# Patient Record
Sex: Male | Born: 1940 | Race: White | Hispanic: No | Marital: Married | Smoking: Never smoker
Health system: Southern US, Community
[De-identification: ages and names within clinical notes are randomized; demographics above are authoritative.]

## PROBLEM LIST (undated history)

## (undated) DIAGNOSIS — K219 Gastro-esophageal reflux disease without esophagitis: Secondary | ICD-10-CM

## (undated) DIAGNOSIS — E119 Type 2 diabetes mellitus without complications: Secondary | ICD-10-CM

## (undated) DIAGNOSIS — I639 Cerebral infarction, unspecified: Secondary | ICD-10-CM

## (undated) DIAGNOSIS — E785 Hyperlipidemia, unspecified: Secondary | ICD-10-CM

## (undated) DIAGNOSIS — Z87442 Personal history of urinary calculi: Secondary | ICD-10-CM

## (undated) DIAGNOSIS — M1990 Unspecified osteoarthritis, unspecified site: Secondary | ICD-10-CM

## (undated) DIAGNOSIS — Z9889 Other specified postprocedural states: Secondary | ICD-10-CM

## (undated) DIAGNOSIS — I10 Essential (primary) hypertension: Secondary | ICD-10-CM

## (undated) DIAGNOSIS — C801 Malignant (primary) neoplasm, unspecified: Secondary | ICD-10-CM

## (undated) HISTORY — PX: FRACTURE SURGERY: SHX138

## (undated) HISTORY — PX: TONSILLECTOMY: SUR1361

## (undated) HISTORY — PX: HERNIA REPAIR: SHX51

---

## 2007-06-25 ENCOUNTER — Ambulatory Visit: Payer: Self-pay

## 2007-12-02 ENCOUNTER — Ambulatory Visit: Payer: Self-pay | Admitting: Internal Medicine

## 2008-07-01 ENCOUNTER — Ambulatory Visit: Payer: Self-pay | Admitting: Unknown Physician Specialty

## 2009-08-16 ENCOUNTER — Ambulatory Visit: Payer: Self-pay | Admitting: Internal Medicine

## 2010-02-21 ENCOUNTER — Ambulatory Visit: Payer: Self-pay | Admitting: Internal Medicine

## 2013-10-22 ENCOUNTER — Ambulatory Visit: Payer: Self-pay | Admitting: Unknown Physician Specialty

## 2017-03-05 DIAGNOSIS — G4730 Sleep apnea, unspecified: Secondary | ICD-10-CM | POA: Diagnosis present

## 2017-03-05 DIAGNOSIS — Z8546 Personal history of malignant neoplasm of prostate: Secondary | ICD-10-CM

## 2018-03-10 DIAGNOSIS — E1142 Type 2 diabetes mellitus with diabetic polyneuropathy: Secondary | ICD-10-CM | POA: Diagnosis present

## 2019-03-18 DIAGNOSIS — I10 Essential (primary) hypertension: Secondary | ICD-10-CM | POA: Diagnosis present

## 2019-08-12 ENCOUNTER — Inpatient Hospital Stay
Admission: EM | Admit: 2019-08-12 | Discharge: 2019-08-13 | DRG: 066 | Disposition: A | Discharge status: Home / Self Care | Payer: Medicare Other | Attending: Hospitalist | Admitting: Hospitalist

## 2019-08-12 ENCOUNTER — Other Ambulatory Visit: Payer: Self-pay

## 2019-08-12 ENCOUNTER — Emergency Department: Payer: Medicare Other

## 2019-08-12 DIAGNOSIS — G4733 Obstructive sleep apnea (adult) (pediatric): Secondary | ICD-10-CM | POA: Diagnosis present

## 2019-08-12 DIAGNOSIS — E1140 Type 2 diabetes mellitus with diabetic neuropathy, unspecified: Secondary | ICD-10-CM | POA: Diagnosis present

## 2019-08-12 DIAGNOSIS — E1142 Type 2 diabetes mellitus with diabetic polyneuropathy: Secondary | ICD-10-CM | POA: Diagnosis present

## 2019-08-12 DIAGNOSIS — E1151 Type 2 diabetes mellitus with diabetic peripheral angiopathy without gangrene: Secondary | ICD-10-CM | POA: Diagnosis present

## 2019-08-12 DIAGNOSIS — Z79899 Other long term (current) drug therapy: Secondary | ICD-10-CM

## 2019-08-12 DIAGNOSIS — Z833 Family history of diabetes mellitus: Secondary | ICD-10-CM

## 2019-08-12 DIAGNOSIS — E669 Obesity, unspecified: Secondary | ICD-10-CM | POA: Diagnosis present

## 2019-08-12 DIAGNOSIS — R29700 NIHSS score 0: Secondary | ICD-10-CM | POA: Diagnosis present

## 2019-08-12 DIAGNOSIS — Z7984 Long term (current) use of oral hypoglycemic drugs: Secondary | ICD-10-CM

## 2019-08-12 DIAGNOSIS — E785 Hyperlipidemia, unspecified: Secondary | ICD-10-CM | POA: Diagnosis present

## 2019-08-12 DIAGNOSIS — R471 Dysarthria and anarthria: Secondary | ICD-10-CM | POA: Diagnosis present

## 2019-08-12 DIAGNOSIS — Z9114 Patient's other noncompliance with medication regimen: Secondary | ICD-10-CM

## 2019-08-12 DIAGNOSIS — R4701 Aphasia: Secondary | ICD-10-CM | POA: Diagnosis present

## 2019-08-12 DIAGNOSIS — D329 Benign neoplasm of meninges, unspecified: Secondary | ICD-10-CM

## 2019-08-12 DIAGNOSIS — I639 Cerebral infarction, unspecified: Secondary | ICD-10-CM | POA: Diagnosis present

## 2019-08-12 DIAGNOSIS — G4730 Sleep apnea, unspecified: Secondary | ICD-10-CM | POA: Diagnosis present

## 2019-08-12 DIAGNOSIS — Z8546 Personal history of malignant neoplasm of prostate: Secondary | ICD-10-CM

## 2019-08-12 DIAGNOSIS — I6381 Other cerebral infarction due to occlusion or stenosis of small artery: Principal | ICD-10-CM

## 2019-08-12 DIAGNOSIS — Z20822 Contact with and (suspected) exposure to covid-19: Secondary | ICD-10-CM | POA: Diagnosis present

## 2019-08-12 DIAGNOSIS — I10 Essential (primary) hypertension: Secondary | ICD-10-CM | POA: Diagnosis present

## 2019-08-12 DIAGNOSIS — Z8042 Family history of malignant neoplasm of prostate: Secondary | ICD-10-CM

## 2019-08-12 DIAGNOSIS — Z6839 Body mass index (BMI) 39.0-39.9, adult: Secondary | ICD-10-CM

## 2019-08-12 DIAGNOSIS — Z7982 Long term (current) use of aspirin: Secondary | ICD-10-CM

## 2019-08-12 HISTORY — DX: Essential (primary) hypertension: I10

## 2019-08-12 HISTORY — DX: Hyperlipidemia, unspecified: E78.5

## 2019-08-12 HISTORY — DX: Type 2 diabetes mellitus without complications: E11.9

## 2019-08-12 LAB — GLUCOSE, CAPILLARY
Glucose-Capillary: 151 mg/dL — ABNORMAL HIGH (ref 70–99)
Glucose-Capillary: 156 mg/dL — ABNORMAL HIGH (ref 70–99)
Glucose-Capillary: 159 mg/dL — ABNORMAL HIGH (ref 70–99)

## 2019-08-12 LAB — CBC
HCT: 47 % (ref 39.0–52.0)
Hemoglobin: 15.7 g/dL (ref 13.0–17.0)
MCH: 30.6 pg (ref 26.0–34.0)
MCHC: 33.4 g/dL (ref 30.0–36.0)
MCV: 91.6 fL (ref 80.0–100.0)
Platelets: 294 10*3/uL (ref 150–400)
RBC: 5.13 MIL/uL (ref 4.22–5.81)
RDW: 13.1 % (ref 11.5–15.5)
WBC: 11.7 10*3/uL — ABNORMAL HIGH (ref 4.0–10.5)
nRBC: 0 % (ref 0.0–0.2)

## 2019-08-12 LAB — COMPREHENSIVE METABOLIC PANEL
ALT: 17 U/L (ref 0–44)
AST: 15 U/L (ref 15–41)
Albumin: 4.2 g/dL (ref 3.5–5.0)
Alkaline Phosphatase: 47 U/L (ref 38–126)
Anion gap: 9 (ref 5–15)
BUN: 14 mg/dL (ref 8–23)
CO2: 24 mmol/L (ref 22–32)
Calcium: 9.3 mg/dL (ref 8.9–10.3)
Chloride: 105 mmol/L (ref 98–111)
Creatinine, Ser: 0.92 mg/dL (ref 0.61–1.24)
GFR calc Af Amer: >60 mL/min (ref >60)
GFR calc non Af Amer: >60 mL/min (ref >60)
Glucose, Bld: 164 mg/dL — ABNORMAL HIGH (ref 70–99)
Potassium: 3.8 mmol/L (ref 3.5–5.1)
Sodium: 138 mmol/L (ref 135–145)
Total Bilirubin: 0.8 mg/dL (ref 0.3–1.2)
Total Protein: 7.5 g/dL (ref 6.5–8.1)

## 2019-08-12 LAB — DIFFERENTIAL
Abs Immature Granulocytes: 0.04 10*3/uL (ref 0.00–0.07)
Basophils Absolute: 0.1 10*3/uL (ref 0.0–0.1)
Basophils Relative: 1 %
Eosinophils Absolute: 0.1 10*3/uL (ref 0.0–0.5)
Eosinophils Relative: 1 %
Immature Granulocytes: 0 %
Lymphocytes Relative: 27 %
Lymphs Abs: 3.1 10*3/uL (ref 0.7–4.0)
Monocytes Absolute: 0.6 10*3/uL (ref 0.1–1.0)
Monocytes Relative: 6 %
Neutro Abs: 7.7 10*3/uL (ref 1.7–7.7)
Neutrophils Relative %: 65 %

## 2019-08-12 LAB — CREATININE, SERUM
Creatinine, Ser: 0.95 mg/dL (ref 0.61–1.24)
GFR calc Af Amer: >60 mL/min (ref >60)
GFR calc non Af Amer: >60 mL/min (ref >60)

## 2019-08-12 LAB — PROTIME-INR
INR: 1 (ref 0.8–1.2)
Prothrombin Time: 12.9 seconds (ref 11.4–15.2)

## 2019-08-12 LAB — APTT: aPTT: 29 seconds (ref 24–36)

## 2019-08-12 MED ORDER — ASPIRIN EC 325 MG PO TBEC
325.0000 mg | DELAYED_RELEASE_TABLET | Freq: Every day | ORAL | Status: DC
  Administered 2019-08-12 – 2019-08-13 (×2): 325 mg via ORAL
  Filled 2019-08-12 (×2): qty 1

## 2019-08-12 MED ORDER — ENOXAPARIN SODIUM 40 MG/0.4ML ~~LOC~~ SOLN
40.0000 mg | SUBCUTANEOUS | Status: DC
  Administered 2019-08-13: 40 mg via SUBCUTANEOUS
  Filled 2019-08-12: qty 0.4

## 2019-08-12 MED ORDER — INSULIN ASPART 100 UNIT/ML ~~LOC~~ SOLN
0.0000 [IU] | SUBCUTANEOUS | Status: DC
  Administered 2019-08-12: 4 [IU] via SUBCUTANEOUS
  Administered 2019-08-13 (×2): 3 [IU] via SUBCUTANEOUS
  Filled 2019-08-12 (×3): qty 1

## 2019-08-12 MED ORDER — ACETAMINOPHEN 650 MG RE SUPP: 650.0000 mg | RECTAL | Status: DC | PRN

## 2019-08-12 MED ORDER — ASPIRIN 81 MG PO CHEW
324.0000 mg | CHEWABLE_TABLET | Freq: Once | ORAL | Status: AC
  Administered 2019-08-12: 324 mg via ORAL
  Filled 2019-08-12: qty 4

## 2019-08-12 MED ORDER — ASPIRIN 300 MG RE SUPP
300.0000 mg | Freq: Every day | RECTAL | Status: DC
  Filled 2019-08-12: qty 1

## 2019-08-12 MED ORDER — SODIUM CHLORIDE 0.9 % IV SOLN: INTRAVENOUS | Status: DC

## 2019-08-12 MED ORDER — ACETAMINOPHEN 325 MG PO TABS: 650.0000 mg | ORAL_TABLET | ORAL | Status: DC | PRN

## 2019-08-12 MED ORDER — STROKE: EARLY STAGES OF RECOVERY BOOK: Freq: Once | Status: DC

## 2019-08-12 MED ORDER — SODIUM CHLORIDE 0.9% FLUSH: 3.0000 mL | Freq: Once | INTRAVENOUS | Status: DC

## 2019-08-12 MED ORDER — ACETAMINOPHEN 160 MG/5ML PO SOLN
650.0000 mg | ORAL | Status: DC | PRN
  Filled 2019-08-12: qty 20.3

## 2019-08-12 NOTE — ED Triage Notes (Signed)
First Nurse Note: Patient reports he was sent by PCP for slurred speech that started yesterday

## 2019-08-12 NOTE — H&P (Signed)
History and Physical    Markevius Dieleman [id number] DOB: Dec 20, 1940 DOA: 08/12/2019  PCP: Patient, No Pcp Per   Patient coming from: home  I have personally briefly reviewed patient's old medical records in [place]  Chief Complaint: slurred speech x 36 hours  HPI: CATALINO GURRY Sr. is a 79 y.o. male with medical history significant for obesity, hypertension, type 2 diabetes with neuropathy and sleep apnea who presented to the emergency room with a complaint of slurred speech that started around 1 PM the day prior.  He denied headache, visual changes and denied numbness weakness of the face or extremities.  ED Course: On arrival to the emergency room blood pressure was 161/109 with otherwise normal vitals.  His blood work was unremarkable.  Showed a 10 mm acute lacunar infarct within the left thalamus.  There was also a finding of a 1.8 x 1.4 mass within the right middle cranial fossa with mild mass-effect likely representing a meningioma.  Patient was given aspirin.  Hospitalist consulted for admission  Review of Systems: As per HPI otherwise 10 point review of systems negative.    Past Medical History:  Diagnosis Date  . Diabetes mellitus without complication ([place])   . Hyperlipidemia   . Hypertension     Past Surgical History:  Procedure Laterality Date  . FRACTURE SURGERY    . HERNIA REPAIR    . TONSILLECTOMY       reports that he has never smoked. He has never used smokeless tobacco. He reports previous alcohol use. He reports previous drug use.  Allergies  Allergen Reactions  . Sulfa Antibiotics Hives and Rash    Pruritis, scalp tingles, whelps Other reaction(s): Other (See Comments) Pruritis, scalp tingles, whelps Other reaction(s): Other (See Comments) Pruritis, scalp tingles, whelps     No family history on file.   Prior to Admission medications   Not on File    Physical Exam: Vitals:   08/12/19 1245 08/12/19 1700 08/12/19 1730  08/12/19 1912  BP:  (!) 153/96 (!) 157/100 (!) 152/96  Pulse:  89 (!) 101 90  Resp:  15 (!) 23 20  Temp:      TempSrc:      SpO2:  95% 99% 97%  Weight: 131.1 kg     Height: 6' (1.829 m)        Vitals:   08/12/19 1245 08/12/19 1700 08/12/19 1730 08/12/19 1912  BP:  (!) 153/96 (!) 157/100 (!) 152/96  Pulse:  89 (!) 101 90  Resp:  15 (!) 23 20  Temp:      TempSrc:      SpO2:  95% 99% 97%  Weight: 131.1 kg     Height: 6' (1.829 m)       Constitutional: NAD, alert and oriented x 3 Eyes: PERRL, lids and conjunctivae normal ENMT: Mucous membranes are moist. Hearing impairment Neck: normal, supple, no masses, no thyromegaly Respiratory: clear to auscultation bilaterally, no wheezing, no crackles. Normal respiratory effort. No accessory muscle use.  Cardiovascular: Regular rate and rhythm, no murmurs / rubs / gallops. No extremity edema. 2+ pedal pulses. No carotid bruits.  Abdomen: no tenderness, no masses palpated. No hepatosplenomegaly. Bowel sounds positive.  Musculoskeletal: no clubbing / cyanosis. No joint deformity upper and lower extremities.  Skin: no rashes, lesions, ulcers.  Neurologic: No gross focal neurologic deficit.speech with mild dysarthria Psychiatric: Normal mood and affect.   Labs on Admission: I have personally reviewed following labs and imaging studies  CBC: Recent Labs  Lab 08/12/19 1247  WBC 11.7*  NEUTROABS 7.7  HGB 15.7  HCT 47.0  MCV 91.6  PLT [id number]   Basic Metabolic Panel: Recent Labs  Lab 08/12/19 1247  NA 138  K 3.8  CL 105  CO2 24  GLUCOSE 164*  BUN 14  CREATININE 0.92  CALCIUM 9.3   GFR: Estimated Creatinine Clearance: 95.7 mL/min (by C-G formula based on SCr of 0.92 mg/dL). Liver Function Tests: Recent Labs  Lab 08/12/19 1247  AST 15  ALT 17  ALKPHOS 47  BILITOT 0.8  PROT 7.5  ALBUMIN 4.2   No results for input(s): LIPASE, AMYLASE in the last 168 hours. No results for input(s): AMMONIA in the last 168  hours. Coagulation Profile: Recent Labs  Lab 08/12/19 1247  INR 1.0   Cardiac Enzymes: No results for input(s): CKTOTAL, CKMB, CKMBINDEX, TROPONINI in the last 168 hours. BNP (last 3 results) No results for input(s): PROBNP in the last 8760 hours. HbA1C: No results for input(s): HGBA1C in the last 72 hours. CBG: Recent Labs  Lab 08/12/19 1253  GLUCAP 151*   Lipid Profile: No results for input(s): CHOL, HDL, LDLCALC, TRIG, CHOLHDL, LDLDIRECT in the last 72 hours. Thyroid Function Tests: No results for input(s): TSH, T4TOTAL, FREET4, T3FREE, THYROIDAB in the last 72 hours. Anemia Panel: No results for input(s): VITAMINB12, FOLATE, FERRITIN, TIBC, IRON, RETICCTPCT in the last 72 hours. Urine analysis: No results found for: COLORURINE, APPEARANCEUR, LABSPEC, [place], GLUCOSEU, [place], BILIRUBINUR, [place], PROTEINUR, UROBILINOGEN, NITRITE, LEUKOCYTESUR  Radiological Exams on Admission: CT HEAD WO CONTRAST  Result Date: 08/12/2019 CLINICAL DATA:  Stroke-like symptoms with slurred speech for 1 day EXAM: CT HEAD WITHOUT CONTRAST TECHNIQUE: Contiguous axial images were obtained from the base of the skull through the vertex without intravenous contrast. COMPARISON:  08/16/2009 FINDINGS: Brain: Mild atrophic changes are noted commensurate with the patient's given age. No findings to suggest acute hemorrhage, acute infarction or space-occupying mass lesion are noted. Old lacunar infarct is noted within the thalamus on the left new from the prior exam. Vascular: No hyperdense vessel or unexpected calcification. Skull: Normal. Negative for fracture or focal lesion. Sinuses/Orbits: No acute finding. Other: None. IMPRESSION: Chronic atrophic and ischemic changes.  No acute abnormality noted. Electronically Signed   By: Inez Catalina M.D.   On: 08/12/2019 13:09   MR BRAIN WO CONTRAST  Result Date: 08/12/2019 CLINICAL DATA:  Focal neuro deficit, greater than 6 hours, stroke suspected. Additional  history provided by scanning technologist: Patient reports feeling "whoozy" and having slurred speech after lunch yesterday. Patient reports today having intermittent slurred speech. EXAM: MRI HEAD WITHOUT CONTRAST TECHNIQUE: Multiplanar, multiecho pulse sequences of the brain and surrounding structures were obtained without intravenous contrast. 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 Corresponding T2/FLAIR hyperintensity at this site. Mild scattered T2/FLAIR hyperintensity within the cerebral white matter is nonspecific, but consistent with chronic small vessel ischemic disease. Mild generalized parenchymal atrophy. Tiny chronic lacunar infarct within the inferior right cerebellum. There is an extra-axial mass along the anterior right middle cranial fossa measuring 1.8 x 1.4 cm in transaxial dimensions (series 12, image 10). This demonstrates T1 and T2 intermediate signal and slight T2 FLAIR hyperintensity. Mild mass effect upon the underlying anterior right temporal lobe. This finding was not present on prior MRI 02/21/2010. No midline shift or extra-axial fluid collection. No chronic intracranial blood  products. Vascular: Flow voids maintained within the proximal large arterial vessels. Skull and upper cervical spine: No focal marrow lesion. Sinuses/Orbits: Visualized orbits demonstrate no acute abnormality. Mild mucosal thickening within the inferior maxillary sinuses. No significant mastoid effusion. 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 1.8 x 1.4 cm extra-axial mass within the anterior right middle cranial fossa as described. This finding was not present on prior MRI 02/21/2010. There is mild mass effect upon the underlying right temporal lobe without parenchymal signal abnormality. This likely reflects a meningioma, but is incompletely assessed on this  noncontrast study. Nonemergent contrast-enhanced brain MRI is recommended for further evaluation. Mild generalized parenchymal atrophy and chronic small vessel ischemic disease. Mild maxillary paranasal sinus mucosal thickening. Electronically Signed   By: Kellie Simmering DO   On: 08/12/2019 19:02    EKG: Independently reviewed.   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 until speech therapy evaluation Physical therapy, occupational therapy and dietary consultations Neurology consult requested  Essential hypertension Allow permissive hypertension for the first 24 hours and resume home antihypertensives when appropriate    Meningioma [facility] Consider elective neurosurgical consult  Type 2 diabetes with neuropathy Supplemental insulin coverage Follow A1c  Obstructive sleep apnea CPAP as desired by patient  History of prostate cancer No acute issues  Obesity with diabetes and obstructive sleep apnea Obesity contributing to chronic medical conditions and overall complicating overall prognosis and care       DVT prophylaxis: lovenox  Code Status: full  Family Communication: none  Disposition Plan: Back to previous home environment Consults called: Neurologist, Dr. Viviano Simas MD [facility]     08/12/2019, 8:10 PM

## 2019-08-12 NOTE — ED Notes (Signed)
Pt given diet soda per his request. Tolerated well.

## 2019-08-12 NOTE — ED Notes (Addendum)
Patient ambulatory to restroom. Gait steady.

## 2019-08-12 NOTE — ED Provider Notes (Signed)
[facility] Emergency Department Provider Note   ____________________________________________   First MD Initiated Contact with Patient 08/12/19 1636     (approximate)  I have reviewed the triage vital signs and the nursing notes.   HISTORY  Chief Complaint Aphasia    HPI Paul Haas. is a 79 y.o. male with past medical history of hypertension hyperlipidemia, and diabetes who presents to the ED for slurred speech.  Patient reports that he has noticed he has been slurring his speech since around 1 PM yesterday.  He complains of feeling "woozy" and lightheaded at times, but denies any vision changes, numbness, or weakness.  Symptoms have been constant since onset and he denies any similar issues in the past.  Family was concerned about a stroke and insisted he be evaluated in the ED.  Patient denies any alcohol or drug abuse.        Past Medical History:  Diagnosis Date  . Diabetes mellitus without complication ([place])   . Hyperlipidemia   . Hypertension     There are no problems to display for this patient.   Past Surgical History:  Procedure Laterality Date  . FRACTURE SURGERY    . HERNIA REPAIR    . TONSILLECTOMY      Prior to Admission medications   Not on File    Allergies Sulfa antibiotics  No family history on file.  Social History Social History   Tobacco Use  . Smoking status: Never Smoker  . Smokeless tobacco: Never Used  Substance Use Topics  . Alcohol use: Not Currently  . Drug use: Not Currently    Review of Systems  Constitutional: No fever/chills Eyes: No visual changes. ENT: No sore throat. Cardiovascular: Denies chest pain. Respiratory: Denies shortness of breath. Gastrointestinal: No abdominal pain.  No nausea, no vomiting.  No diarrhea.  No constipation. Genitourinary: Negative for dysuria. Musculoskeletal: Negative for back pain. Skin: Negative for rash. Neurological: Negative for headaches,  focal weakness or numbness.  Positive for slurred speech.  ____________________________________________   PHYSICAL EXAM:  VITAL SIGNS: ED Triage Vitals  Enc Vitals Group     BP 08/12/19 1243 (!) 161/109     Pulse Rate 08/12/19 1243 86     Resp 08/12/19 1243 17     Temp 08/12/19 1243 97.9 F (36.6 C)     Temp Source 08/12/19 1243 Oral     SpO2 08/12/19 1243 95 %     Weight 08/12/19 1245 289 lb (131.1 kg)     Height 08/12/19 1245 6' (1.829 m)     Head Circumference --      Peak Flow --      Pain Score 08/12/19 1245 0     Pain Loc --      Pain Edu? --      Excl. in [place]? --     Constitutional: Alert and oriented. Eyes: Conjunctivae are normal. Head: Atraumatic. Nose: No congestion/rhinnorhea. Mouth/Throat: Mucous membranes are moist. Neck: Normal ROM Cardiovascular: Normal rate, regular rhythm. Grossly normal heart sounds. Respiratory: Normal respiratory effort.  No retractions. Lungs CTAB. Gastrointestinal: Soft and nontender. No distention. Genitourinary: deferred Musculoskeletal: No lower extremity tenderness nor edema. Neurologic:  Normal speech and language. No gross focal neurologic deficits are appreciated.  Dysarthria noted.  5 out of 5 strength in bilateral upper and lower extremities with no pronator drift. Skin:  Skin is warm, dry and intact. No rash noted. Psychiatric: Mood and affect are normal. Speech and behavior are  normal.  ____________________________________________   LABS (all labs ordered are listed, but only abnormal results are displayed)  Labs Reviewed  CBC - Abnormal; Notable for the following components:      Result Value   WBC 11.7 (*)    All other components within normal limits  COMPREHENSIVE METABOLIC PANEL - Abnormal; Notable for the following components:   Glucose, Bld 164 (*)    All other components within normal limits  GLUCOSE, CAPILLARY - Abnormal; Notable for the following components:   Glucose-Capillary 151 (*)    All other  components within normal limits  SARS CORONAVIRUS 2 (TAT 6-24 HRS)  PROTIME-INR  APTT  DIFFERENTIAL  CBG MONITORING, ED  I-STAT CREATININE, ED   ____________________________________________  EKG  ED ECG REPORT I, Blake Divine, the attending physician, personally viewed and interpreted this ECG.   Date: 08/12/2019  EKG Time: 12:55  Rate: 83  Rhythm: normal sinus rhythm  Axis: LAD  Intervals:right bundle branch block  ST&T Change: None   PROCEDURES  Procedure(s) performed (including Critical Care):  Procedures   ____________________________________________   INITIAL IMPRESSION / ASSESSMENT AND PLAN / ED COURSE       79 year old male with history of hypertension, hyperlipidemia, and diabetes presents to the ED with slurred speech since 1 PM yesterday afternoon.  He does appear to have dysarthria on exam but no apparent aphasia and he is otherwise neurologically intact.  CT head was obtained in triage and is negative for acute process.  Patient is outside of the window for either TPA or endovascular intervention, will give a dose of aspirin.  Lab work is thus far unremarkable.  Given focal neurologic finding, I advised patient to be admitted for further stroke work-up, however he adamantly declines this.  He initially was unwilling to stay for MRI, but was eventually convinced to do so.  MRI significant for acute thalamic stroke, neurologic exam remains unchanged.  He is also noted to have what appears to be a meningioma, but this seems unlikely to be contributing to his current presentation.  Patient now agreeable to admission for further stroke work-up.      ____________________________________________   FINAL CLINICAL IMPRESSION(S) / ED DIAGNOSES  Final diagnoses:  Dysarthria  Thalamic stroke ([place])  Meningioma [facility])     ED Discharge Orders    None       Note:  This document was prepared using Dragon voice recognition software and may include  unintentional dictation errors.   Blake Divine, MD 08/12/19 [phone]

## 2019-08-12 NOTE — ED Notes (Signed)
Pt inquiring about wait; triage process and wait times explained to patient at this time.  Pt states, "Ok, I will wait about 30 more minutes."  Ambulatory in lobby, NAD noted at this time.

## 2019-08-12 NOTE — ED Triage Notes (Signed)
Pt c/o feeling "whoozy" and having slurred speech after lunch yesterday , states he today having intermittent slurred speech, slurred speech noted on arrival. Denies feeling dizzy or and numbness or tingling, denies any weakness, face is symmetrical. Pt ambulatory to triage with a steady gait, pt is a/ox4 at present.

## 2019-08-13 ENCOUNTER — Inpatient Hospital Stay: Payer: Medicare Other

## 2019-08-13 ENCOUNTER — Inpatient Hospital Stay
Admit: 2019-08-13 | Discharge: 2019-08-13 | Disposition: A | Discharge status: Home / Self Care | Payer: Medicare Other | Attending: Internal Medicine | Admitting: Internal Medicine

## 2019-08-13 DIAGNOSIS — E1140 Type 2 diabetes mellitus with diabetic neuropathy, unspecified: Secondary | ICD-10-CM | POA: Diagnosis present

## 2019-08-13 DIAGNOSIS — Z833 Family history of diabetes mellitus: Secondary | ICD-10-CM | POA: Diagnosis not present

## 2019-08-13 DIAGNOSIS — I6381 Other cerebral infarction due to occlusion or stenosis of small artery: Secondary | ICD-10-CM | POA: Diagnosis present

## 2019-08-13 DIAGNOSIS — Z8546 Personal history of malignant neoplasm of prostate: Secondary | ICD-10-CM | POA: Diagnosis not present

## 2019-08-13 DIAGNOSIS — I639 Cerebral infarction, unspecified: Secondary | ICD-10-CM | POA: Diagnosis not present

## 2019-08-13 DIAGNOSIS — E1151 Type 2 diabetes mellitus with diabetic peripheral angiopathy without gangrene: Secondary | ICD-10-CM | POA: Diagnosis present

## 2019-08-13 DIAGNOSIS — Z79899 Other long term (current) drug therapy: Secondary | ICD-10-CM | POA: Diagnosis not present

## 2019-08-13 DIAGNOSIS — R471 Dysarthria and anarthria: Secondary | ICD-10-CM | POA: Diagnosis present

## 2019-08-13 DIAGNOSIS — Z6839 Body mass index (BMI) 39.0-39.9, adult: Secondary | ICD-10-CM | POA: Diagnosis not present

## 2019-08-13 DIAGNOSIS — E669 Obesity, unspecified: Secondary | ICD-10-CM | POA: Diagnosis present

## 2019-08-13 DIAGNOSIS — E785 Hyperlipidemia, unspecified: Secondary | ICD-10-CM | POA: Diagnosis present

## 2019-08-13 DIAGNOSIS — Z7984 Long term (current) use of oral hypoglycemic drugs: Secondary | ICD-10-CM | POA: Diagnosis not present

## 2019-08-13 DIAGNOSIS — R4701 Aphasia: Secondary | ICD-10-CM | POA: Diagnosis present

## 2019-08-13 DIAGNOSIS — Z9114 Patient's other noncompliance with medication regimen: Secondary | ICD-10-CM | POA: Diagnosis not present

## 2019-08-13 DIAGNOSIS — I10 Essential (primary) hypertension: Secondary | ICD-10-CM | POA: Diagnosis present

## 2019-08-13 DIAGNOSIS — Z7982 Long term (current) use of aspirin: Secondary | ICD-10-CM | POA: Diagnosis not present

## 2019-08-13 DIAGNOSIS — G4733 Obstructive sleep apnea (adult) (pediatric): Secondary | ICD-10-CM | POA: Diagnosis present

## 2019-08-13 DIAGNOSIS — Z20822 Contact with and (suspected) exposure to covid-19: Secondary | ICD-10-CM | POA: Diagnosis present

## 2019-08-13 DIAGNOSIS — D329 Benign neoplasm of meninges, unspecified: Secondary | ICD-10-CM | POA: Diagnosis present

## 2019-08-13 DIAGNOSIS — Z8042 Family history of malignant neoplasm of prostate: Secondary | ICD-10-CM | POA: Diagnosis not present

## 2019-08-13 DIAGNOSIS — R29700 NIHSS score 0: Secondary | ICD-10-CM | POA: Diagnosis present

## 2019-08-13 DIAGNOSIS — E1142 Type 2 diabetes mellitus with diabetic polyneuropathy: Secondary | ICD-10-CM | POA: Diagnosis present

## 2019-08-13 LAB — GLUCOSE, CAPILLARY
Glucose-Capillary: 111 mg/dL — ABNORMAL HIGH (ref 70–99)
Glucose-Capillary: 149 mg/dL — ABNORMAL HIGH (ref 70–99)
Glucose-Capillary: 139 mg/dL — ABNORMAL HIGH (ref 70–99)
Glucose-Capillary: 82 mg/dL (ref 70–99)

## 2019-08-13 LAB — LIPID PANEL
Cholesterol: 176 mg/dL (ref 0–200)
HDL: 39 mg/dL — ABNORMAL LOW (ref >40)
LDL Cholesterol: 118 mg/dL — ABNORMAL HIGH (ref 0–99)
Total CHOL/HDL Ratio: 4.5 RATIO
Triglycerides: 96 mg/dL (ref <150)
VLDL: 19 mg/dL (ref 0–40)

## 2019-08-13 LAB — HEMOGLOBIN A1C
Hgb A1c MFr Bld: 7.9 % — ABNORMAL HIGH (ref 4.8–5.6)
Mean Plasma Glucose: 180.03 mg/dL

## 2019-08-13 LAB — SARS CORONAVIRUS 2 (TAT 6-24 HRS): SARS Coronavirus 2: NEGATIVE

## 2019-08-13 MED ORDER — CLOPIDOGREL BISULFATE 75 MG PO TABS: 75.0000 mg | ORAL_TABLET | Freq: Every day | ORAL | Status: DC

## 2019-08-13 MED ORDER — ASPIRIN 81 MG PO TBEC: 81.0000 mg | DELAYED_RELEASE_TABLET | Freq: Every day | ORAL | 0 refills | Status: AC

## 2019-08-13 MED ORDER — ASPIRIN EC 81 MG PO TBEC: 81.0000 mg | DELAYED_RELEASE_TABLET | Freq: Every day | ORAL | Status: DC

## 2019-08-13 MED ORDER — STROKE: EARLY STAGES OF RECOVERY BOOK: 1.0000 | Freq: Once | Status: AC

## 2019-08-13 MED ORDER — CLOPIDOGREL BISULFATE 75 MG PO TABS: 75.0000 mg | ORAL_TABLET | Freq: Every day | ORAL | 2 refills | Status: AC

## 2019-08-13 NOTE — Progress Notes (Signed)
SLP Cancellation Note  Patient Details Name: WALDEN CROFOOT Sr. MRN: [id number] DOB: October 13, 1940   Cancelled treatment:       Reason Eval/Treat Not Completed: SLP screened, no acute needs identified, will sign off;Other (comment); Chart reviewed, pt interviewed at length. Pt suffered a L thalamic CVA approx 2 days ago, symptoms have largely resolved. Pt reported he just spoke w/MD who informed him he could be discharged home today. No apparent evidence of aphasia, dysphagia, or cognitive linguistic deficits. Spontaneous speech is 100% intelligible, composed of complete cogent sentences. However noted mild to moderate dysarthria, c/b decreased articulatory precision and decreased breath support. Pt would benefit from outpatient SLP tx to evaluate, treat and restore articulatory precision. Discussed d/c recommendations w/MD. SLP to sign off at this time, no acute needs identified.   Azhane Eckart, MA, CCC-SLP 08/13/2019, 3:20 PM

## 2019-08-13 NOTE — Progress Notes (Signed)
Physical Therapy Evaluation Patient Details Name: Paul FLOCCO Sr. MRN: [id number] DOB: 08-02-1940 Today's Date: 08/13/2019   History of Present Illness  79 y.o. male with medical history significant for obesity, hypertension, type 2 diabetes with neuropathy and sleep apnea. Per pt he reports previously Covid-19+ (Sept 2020, lost sense of taste/smell, no other symptoms). Covid-19 test from 08/12/19 negative. Pt presented to the emergency room on 08/12/19 with a complaint of slurred speech that started around 1 PM the day prior.  He denied headache, visual changes and denied numbness weakness of the face or extremities. Imaging + for acute L thalamic infarct.  Clinical Impression  Pt admitted with above diagnosis. Pt currently with functional limitations due to the deficits listed below (see PT Problem List).  Pt is modified independent for bed mobility and supervision only for transfers. He demonstrates safe hand placement, good weight shift, and good stability in standing. He is able to complete a full lap around the RN station with therapist. Initially pt utilizes IV pole but progresses to no assistive device. Mild shuffling noted, worse on the L with decreased toe to floor clearance but no foot drop/slap noted. Pt is able to perform horizontal and vertical head turns without LOB. He is able to perform feet apart eyes closed without excessive sway or LOB. He is able to place feet together independently but loses balance after approximately 10s once closing eyes. Single leg balance is <3s bilaterally. UE/LE strength appears grossly symmetrical and WFL. Coordination testing is WNL. No deficits identified which appear to be related to CVA. He has some balance deficits that wife reports existed prior to his CVA and would likely benefit from OP PT for balance therapy. Pt is currently not interested and refuses these services. Pt will benefit from PT services to address deficits in strength, balance, and  mobility in order to return to full function at home.      Follow Up Recommendations Outpatient PT(for baseline balance deficits, appear unrelated to CVA) Pt currently refusing OP PT services after discharge.    Equipment Recommendations  None recommended by PT    Recommendations for Other Services       Precautions / Restrictions Precautions Precautions: Fall Restrictions Weight Bearing Restrictions: No      Mobility  Bed Mobility Overal bed mobility: Modified Independent                Transfers Overall transfer level: Modified independent Equipment used: None Transfers: Sit to/from Stand Sit to Stand: Supervision         General transfer comment: Pt demonstrates safe hand placement, good weight shift, and good stability in standing  Ambulation/Gait Ambulation/Gait assistance: Min guard Gait Distance (Feet): 150 Feet Assistive device: None       General Gait Details: Pt is able to complete a full lap around the RN station with therapist. Initially pt utilizes IV pole but progresses to no assistive device. Mild shuffling noted, worse on the L with decreased toe to floor clearance but no foot drop/slap noted. Pt is able to perform horizontal and vertical head turns without LOB.   Stairs            Wheelchair Mobility    Modified Rankin (Stroke Patients Only)       Balance Overall balance assessment: Needs assistance Sitting-balance support: No upper extremity supported Sitting balance-Leahy Scale: Normal     Standing balance support: No upper extremity supported Standing balance-Leahy Scale: Fair Standing balance comment: Pt is able to  perform feet apart eyes closed without excessive sway or LOB. He is able to place feet together independently but loses balance after approximately 10s once closing eyes. Single leg balance is <3s bilaterally                             Pertinent Vitals/Pain Pain Assessment: No/denies pain     Home Living Family/patient expects to be discharged to:: Private residence Living Arrangements: Spouse/significant other Available Help at Discharge: Family;Available 24 hours/day Type of Home: House Home Access: Level entry     Home Layout: Two level;Able to live on main level with bedroom/bathroom Home Equipment: Other (comment) Additional Comments: Pt has a walking stick    Prior Function Level of Independence: Independent         Comments: Pt independent in all aspects, working (desk work), 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     Hand Dominance   Dominant Hand: Right    Extremity/Trunk Assessment   Upper Extremity Assessment Upper Extremity Assessment: Overall WFL for tasks assessed    Lower Extremity Assessment Lower Extremity Assessment: Overall WFL for tasks assessed(Possible very mild L ankle DF weakness during gait)    Cervical / Trunk Assessment Cervical / Trunk Assessment: Normal  Communication   Communication: Expressive difficulties(Slurred speech)  Cognition Arousal/Alertness: Awake/alert Behavior During Therapy: WFL for tasks assessed/performed Overall Cognitive Status: Within Functional Limits for tasks assessed                                 General Comments: mildly impulsive with mobility tasks      General Comments      Exercises Other Exercises Other Exercises: Pt educated in s/s of stroke and strategies to improve independence with LB dressing and minimize falls risks   Assessment/Plan    PT Assessment Patient needs continued PT services  PT Problem List Decreased balance       PT Treatment Interventions DME instruction;Functional mobility training;Therapeutic activities;Therapeutic exercise;Balance training;Neuromuscular re-education    PT Goals (Current goals can be found in the Care Plan section)  Acute Rehab PT Goals Patient Stated Goal: go home PT Goal Formulation: With patient/family Time For Goal Achievement: 08/27/19 Potential  to Achieve Goals: Good    Frequency 7X/week   Barriers to discharge        Co-evaluation               AM-PAC PT "6 Clicks" Mobility  Outcome Measure Help needed turning from your back to your side while in a flat bed without using bedrails?: None Help needed moving from lying on your back to sitting on the side of a flat bed without using bedrails?: None Help needed moving to and from a bed to a chair (including a wheelchair)?: None Help needed standing up from a chair using your arms (e.g., wheelchair or bedside chair)?: None Help needed to walk in hospital room?: None Help needed climbing 3-5 steps with a railing? : A Little 6 Click Score: 23    End of Session Equipment Utilized During Treatment: Gait belt Activity Tolerance: Patient tolerated treatment well Patient left: in bed;with call bell/phone within reach;with nursing/sitter in room   PT Visit Diagnosis: Unsteadiness on feet (R26.81)    Time: [id number] PT Time Calculation (min) (ACUTE ONLY): 18 min   Charges:   PT Evaluation $PT Eval Low Complexity: 1 Low  Paul Haas PT, DPT, GCS   Terrian Ridlon 08/13/2019, 12:12 PM

## 2019-08-13 NOTE — Discharge Summary (Signed)
Physician Discharge Summary   TARIN PATRICELLI Sr.  male DOB: Oct 24, 1940  ED:[id number]  PCP: Patient, No Pcp Per  Admit date: 08/12/2019 Discharge date: 08/13/2019  Admitted From: home Disposition:  home CODE STATUS: Full code  Discharge Instructions    Diet - low sodium heart healthy   Complete by: As directed    Discharge instructions   Complete by: As directed    You have had a stroke, and the ways to prevent future strokes are to control your diabetes, cholesterol and blood pressure.    Neurology wants you to take aspirin 81 and Plavix 75 mg daily for the next 3 weeks, (last day 09/02/19), and then continue just Plavix 75 mg daily by itself.    Please follow up with your PCP to work on your blood sugar control.  Dr. Enzo Bi - -   Increase activity slowly   Complete by: As directed        Hospital Course:  For full details, please see H&P, progress notes, consult notes and ancillary notes.  Briefly,  Burnett Kanaris Sr. is a 79 y.o. male with medical history significant for obesity, hypertension, type 2 diabetes with neuropathy and sleep apnea who presented to the emergency room with a complaint of slurred speech that started around 1 PM the day prior.    On arrival to the emergency room blood pressure was 161/109 with otherwise normal vitals.  His blood work was unremarkable.  Showed a 10 mm acute lacunar infarct within the left thalamus.  There was also a finding of a 1.8 x 1.4 mass within the right middle cranial fossa with mild mass-effect likely representing a meningioma.  Patient arrived 36 hours after onset of dysarthria and outside TPA window Aspirin administered from the emergency room  Acute thalamic infarction [facility]) with dysarthria Neurology consulted and deemed etiology likely small vessel disease.  US carotid showed no significant stenosis.  TTE unremarkable and showed no shunt. A1c 7.9, LDL 118.  Neuro recommended "Prophylactic therapy-Dual  antiplatelet therapy with ASA 81mg  and Plavix 75mg  for three weeks with change to Plavix 75mg  daily alone as monotherapy after that time."  Pt was advised to be compliant with statin therapy for lipid management with target LDL<70.  PT/OT/SLP evaluated patient.  Patient speech improved rapidly and developed no additional neurological deficit, so he was discharged after 1 overnight stay.  Essential hypertension Allowed permissive hypertension for the first 24 hours and resumed on home antihypertensives at discharge.  Meningioma [facility]) Consider elective neurosurgical consult as outpatient.  Type 2 diabetes with neuropathy Discharged on home diabetic regimen.    History of prostate cancer No acute issues    Discharge Diagnoses:  Principal Problem:   Acute thalamic infarction [facility]) Active Problems:   DM type 2 with diabetic peripheral neuropathy (HCC)   Sleep apnea   H/O prostate cancer   Essential hypertension   Meningioma ([place])   Acute ischemic stroke [facility])    Discharge Instructions:  Allergies as of 08/13/2019      Reactions   Sulfa Antibiotics Hives, Rash   Pruritis, scalp tingles, whelps Other reaction(s): Other (See Comments) Pruritis, scalp tingles, whelps Other reaction(s): Other (See Comments) Pruritis, scalp tingles, whelps      Medication List    TAKE these medications   aspirin 81 MG EC tablet Take 1 tablet (81 mg total) by mouth daily for 21 days.   bicalutamide 50 MG tablet Commonly known as: CASODEX Take 50 mg by mouth  daily.   clopidogrel 75 MG tablet Commonly known as: PLAVIX Take 1 tablet (75 mg total) by mouth daily. Stroke prevention.   dutasteride 0.5 MG capsule Commonly known as: AVODART Take 0.5 mg by mouth daily.   furosemide 40 MG tablet Commonly known as: LASIX Take 40 mg by mouth daily as needed for fluid.   glimepiride 4 MG tablet Commonly known as: AMARYL Take 4 mg by mouth 2 (two) times daily.   lisinopril 5 MG  tablet Commonly known as: ZESTRIL Take 5 mg by mouth daily.   metFORMIN 1000 MG tablet Commonly known as: GLUCOPHAGE Take 1,000 mg by mouth 2 (two) times daily.   Multi-Vitamin tablet Take 1 tablet by mouth daily.   pravastatin 40 MG tablet Commonly known as: PRAVACHOL Take 40 mg by mouth at bedtime.   tamsulosin 0.4 MG Caps capsule Commonly known as: FLOMAX Take 0.4 mg by mouth daily.     ASK your doctor about these medications    stroke: mapping our early stages of recovery book Misc 1 each by Does not apply route once for 1 dose. Ask about: Should I take this medication?       Follow-up Information    Anabel Bene, MD. Schedule an appointment as soon as possible for a visit in 2 month(s).   Specialty: Neurology Contact information: [place] [address] [phone]           Allergies  Allergen Reactions  . Sulfa Antibiotics Hives and Rash    Pruritis, scalp tingles, whelps Other reaction(s): Other (See Comments) Pruritis, scalp tingles, whelps Other reaction(s): Other (See Comments) Pruritis, scalp tingles, whelps      The results of significant diagnostics from this hospitalization (including imaging, microbiology, ancillary and laboratory) are listed below for reference.   Consultations:   Procedures/Studies: CT HEAD WO CONTRAST  Result Date: 08/12/2019 CLINICAL DATA:  Stroke-like symptoms with slurred speech for 1 day EXAM: CT HEAD WITHOUT CONTRAST TECHNIQUE: Contiguous axial images were obtained from the base of the skull through the vertex without intravenous contrast. COMPARISON:  08/16/2009 FINDINGS: Brain: Mild atrophic changes are noted commensurate with the patient's given age. No findings to suggest acute hemorrhage, acute infarction or space-occupying mass lesion are noted. Old lacunar infarct is noted within the thalamus on the left new from the prior exam. Vascular: No hyperdense vessel or unexpected calcification.  Skull: Normal. Negative for fracture or focal lesion. Sinuses/Orbits: No acute finding. Other: None. IMPRESSION: Chronic atrophic and ischemic changes.  No acute abnormality noted. Electronically Signed   By: Inez Catalina M.D.   On: 08/12/2019 13:09   MR BRAIN WO CONTRAST  Result Date: 08/12/2019 CLINICAL DATA:  Focal neuro deficit, greater than 6 hours, stroke suspected. Additional history provided by scanning technologist: Patient reports feeling "whoozy" and having slurred speech after lunch yesterday. Patient reports today having intermittent slurred speech. EXAM: MRI HEAD WITHOUT CONTRAST TECHNIQUE: Multiplanar, multiecho pulse sequences of the brain and surrounding structures were obtained without intravenous contrast. 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 Corresponding T2/FLAIR hyperintensity at this site. Mild scattered T2/FLAIR hyperintensity within the cerebral white matter is nonspecific, but consistent with chronic small vessel ischemic disease. Mild generalized parenchymal atrophy. Tiny chronic lacunar infarct within the inferior right cerebellum. There is an extra-axial mass along the anterior right middle cranial fossa measuring 1.8 x 1.4 cm in  transaxial dimensions (series 12, image 10). This demonstrates T1 and T2 intermediate signal and slight T2 FLAIR hyperintensity. Mild mass effect upon the underlying anterior right temporal lobe. This finding was not present on prior MRI 02/21/2010. No midline shift or extra-axial fluid collection. No chronic intracranial blood products. Vascular: Flow voids maintained within the proximal large arterial vessels. Skull and upper cervical spine: No focal marrow lesion. Sinuses/Orbits: Visualized orbits demonstrate no acute abnormality. Mild mucosal thickening within the inferior maxillary sinuses. No significant mastoid effusion.  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 1.8 x 1.4 cm extra-axial mass within the anterior right middle cranial fossa as described. This finding was not present on prior MRI 02/21/2010. There is mild mass effect upon the underlying right temporal lobe without parenchymal signal abnormality. 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 Nonemergent contrast-enhanced brain MRI is recommended for further evaluation. Mild generalized parenchymal atrophy and chronic small vessel ischemic disease. Mild maxillary paranasal sinus mucosal thickening. Electronically Signed   By: Kellie Simmering DO   On: 08/12/2019 19:02   US Carotid Bilateral (at [facility] and [facility] only)  Result Date: 08/13/2019 CLINICAL DATA:  Stroke.  Hypertension, hyperlipidemia, diabetes. EXAM: BILATERAL CAROTID DUPLEX ULTRASOUND TECHNIQUE: Pearline Cables scale imaging, color Doppler and duplex ultrasound were performed of bilateral carotid and vertebral arteries in the neck. COMPARISON:  None. FINDINGS: Criteria: Quantification of carotid stenosis is based on velocity parameters that correlate the residual internal carotid diameter with NASCET-based stenosis levels, using the diameter of the distal internal carotid lumen as the denominator for stenosis measurement. The following velocity measurements were obtained: RIGHT ICA: 46/7 cm/sec CCA: [id number] cm/sec SYSTOLIC ICA/CCA RATIO:  0.6 ECA: 98 cm/sec LEFT ICA: 54/13 cm/sec CCA: [id number] cm/sec SYSTOLIC ICA/CCA RATIO:  0.6 ECA: 75 cm/sec RIGHT CAROTID ARTERY: Mild tortuosity. No significant plaque accumulation or stenosis. Normal waveforms and color Doppler signal. RIGHT VERTEBRAL ARTERY:  Normal flow direction and waveform. LEFT CAROTID ARTERY: Minimal eccentric plaque in the bifurcation. No high-grade stenosis. Normal waveforms and color Doppler signal. Mild tortuosity. LEFT VERTEBRAL ARTERY:  Normal flow direction and waveform. IMPRESSION: 1. Mild left  carotid bifurcation plaque resulting in less than 50% diameter stenosis. 2. No significant right carotid plaque. 3.  Antegrade bilateral vertebral arterial flow. Electronically Signed   By: Lucrezia Europe M.D.   On: 08/13/2019 13:51   ECHOCARDIOGRAM COMPLETE  Result Date: 08/14/2019   ECHOCARDIOGRAM REPORT   Patient Name:   PRAJIT BALTAZAR Sr. Date of Exam: 08/13/2019 Medical Rec #:  [id number]             Height:       72.0 in Accession #:    [id number]            Weight:       289.0 lb Date of Birth:  June 24, 1941             BSA:          2.49 m Patient Age:    30 years              BP:           158/100 mmHg Patient Gender: M                     HR:           73 bpm. Exam Location:  [facility] Procedure: 2D Echo, Cardiac Doppler and Color Doppler Indications:  Stroke 434.91  History:         Patient has no prior history of Echocardiogram examinations.                  Risk Factors:Hypertension and Diabetes.  Sonographer:     Sherrie Sport RDCS (AE) Referring Phys:  [id number] Athena Masse Diagnosing Phys: Yolonda Kida MD IMPRESSIONS  1. Left ventricular ejection fraction, by visual estimation, is 65 to 70%. The left ventricle has normal function. Left ventricular septal wall thickness was normal. Normal left ventricular posterior wall thickness. There is no left ventricular hypertrophy.  2. The left ventricle has no regional wall motion abnormalities.  3. Global right ventricle has normal systolic function.The right ventricular size is normal. No increase in right ventricular wall thickness.  4. Left atrial size was normal.  5. Right atrial size was normal.  6. The mitral valve is normal in structure. Trivial mitral valve regurgitation.  7. The tricuspid valve is normal in structure.  8. The tricuspid valve is normal in structure. Tricuspid valve regurgitation is mild.  9. The aortic valve is normal in structure. Aortic valve regurgitation is not visualized. 10. The pulmonic valve was normal in structure. Pulmonic  valve regurgitation is not visualized. 11. Normal pulmonary artery systolic pressure. FINDINGS  Left Ventricle: Left ventricular ejection fraction, by visual estimation, is 65 to 70%. The left ventricle has normal function. The left ventricle has no regional wall motion abnormalities. Normal left ventricular posterior wall thickness. There is no left ventricular hypertrophy. Right Ventricle: The right ventricular size is normal. No increase in right ventricular wall thickness. Global RV systolic function is has normal systolic function. The tricuspid regurgitant velocity is 1.24 m/s, and with an assumed right atrial pressure  of 10 mmHg, the estimated right ventricular systolic pressure is normal at 16.2 mmHg. Left Atrium: Left atrial size was normal in size. Right Atrium: Right atrial size was normal in size Pericardium: There is no evidence of pericardial effusion. Mitral Valve: The mitral valve is normal in structure. Trivial mitral valve regurgitation. Tricuspid Valve: The tricuspid valve is normal in structure. Tricuspid valve regurgitation is mild. Aortic Valve: The aortic valve is normal in structure. Aortic valve regurgitation is not visualized. Aortic valve mean gradient measures 3.0 mmHg. Aortic valve peak gradient measures 5.3 mmHg. Aortic valve area, by VTI measures 3.40 cm. Pulmonic Valve: The pulmonic valve was normal in structure. Pulmonic valve regurgitation is not visualized. Pulmonic regurgitation is not visualized. Aorta: The aortic root is normal in size and structure. IAS/Shunts: No atrial level shunt detected by color flow Doppler.  LEFT VENTRICLE PLAX 2D LVIDd:         5.38 cm  Diastology LVIDs:         3.02 cm  LV e' lateral:   5.77 cm/s LV PW:         1.48 cm  LV E/e' lateral: 8.6 LV IVS:        1.12 cm  LV e' medial:    4.35 cm/s LVOT diam:     2.20 cm  LV E/e' medial:  11.4 LV SV:         105 ml LV SV Index:   39.69 LVOT Area:     3.80 cm  RIGHT VENTRICLE RV Basal diam:  4.66 cm RV S  prime:     12.10 cm/s TAPSE (M-mode): 3.7 cm LEFT ATRIUM           Index  RIGHT ATRIUM           Index LA diam:      3.00 cm 1.20 cm/m  RA Area:     19.90 cm LA Vol (A4C): 58.0 ml 23.28 ml/m RA Volume:   62.50 ml  25.09 ml/m  AORTIC VALVE                   PULMONIC VALVE AV Area (Vmax):    3.02 cm    PV Vmax:        0.79 m/s AV Area (Vmean):   3.57 cm    PV Peak grad:   2.5 mmHg AV Area (VTI):     3.40 cm    RVOT Peak grad: 2 mmHg AV Vmax:           115.25 cm/s AV Vmean:          74.550 cm/s AV VTI:            0.196 m AV Peak Grad:      5.3 mmHg AV Mean Grad:      3.0 mmHg LVOT Vmax:         91.60 cm/s LVOT Vmean:        70.000 cm/s LVOT VTI:          0.176 m LVOT/AV VTI ratio: 0.90  AORTA Ao Root diam: 3.67 cm MITRAL VALVE                        TRICUSPID VALVE MV Area (PHT): 3.89 cm             TR Peak grad:   6.2 mmHg MV PHT:        56.55 msec           TR Vmax:        124.00 cm/s MV Decel Time: 195 msec MV E velocity: 49.50 cm/s 103 cm/s  SHUNTS MV A velocity: 89.70 cm/s 70.3 cm/s Systemic VTI:  0.18 m MV E/A ratio:  0.55       1.5       Systemic Diam: 2.20 cm  Dwayne D Callwood MD Electronically signed by Yolonda Kida MD Signature Date/Time: 08/14/2019/8:19:08 AM    Final       Labs: BNP (last 3 results) No results for input(s): BNP in the last 8760 hours. Basic Metabolic Panel: No results for input(s): NA, K, CL, CO2, GLUCOSE, BUN, CREATININE, CALCIUM, MG, PHOS in the last 168 hours. Liver Function Tests: No results for input(s): AST, ALT, ALKPHOS, BILITOT, PROT, ALBUMIN in the last 168 hours. No results for input(s): LIPASE, AMYLASE in the last 168 hours. No results for input(s): AMMONIA in the last 168 hours. CBC: No results for input(s): WBC, NEUTROABS, HGB, HCT, MCV, PLT in the last 168 hours. Cardiac Enzymes: No results for input(s): CKTOTAL, CKMB, CKMBINDEX, TROPONINI in the last 168 hours. BNP: Invalid input(s): POCBNP CBG: Recent Labs  Lab 08/13/19 0416  08/13/19 0816 08/13/19 1204 08/13/19 1658  GLUCAP 111* 149* 139* 82   D-Dimer No results for input(s): DDIMER in the last 72 hours. Hgb A1c No results for input(s): HGBA1C in the last 72 hours. Lipid Profile No results for input(s): CHOL, HDL, LDLCALC, TRIG, CHOLHDL, LDLDIRECT in the last 72 hours. Thyroid function studies No results for input(s): TSH, T4TOTAL, T3FREE, THYROIDAB in the last 72 hours.  Invalid input(s): FREET3 Anemia work up No results for input(s): VITAMINB12, FOLATE, FERRITIN, TIBC, IRON, RETICCTPCT  in the last 72 hours. Urinalysis No results found for: COLORURINE, APPEARANCEUR, [place], [place], [place], [place], [place], [place], PROTEINUR, UROBILINOGEN, NITRITE, LEUKOCYTESUR Sepsis Labs Invalid input(s): PROCALCITONIN,  WBC,  LACTICIDVEN Microbiology Recent Results (from the past 240 hour(s))  SARS CORONAVIRUS 2 (TAT 6-24 HRS) Nasopharyngeal Nasopharyngeal Swab     Status: None   Collection Time: 08/12/19  9:19 PM   Specimen: Nasopharyngeal Swab  Result Value Ref Range Status   SARS Coronavirus 2 NEGATIVE NEGATIVE Final    Comment: (NOTE) SARS-CoV-2 target nucleic acids are NOT DETECTED. The SARS-CoV-2 RNA is generally detectable in upper and lower respiratory specimens during the acute phase of infection. Negative results do not preclude SARS-CoV-2 infection, do not rule out co-infections with other pathogens, and should not be used as the sole basis for treatment or other patient management decisions. Negative results must be combined with clinical observations, patient history, and epidemiological information. The expected result is Negative. Fact Sheet for Patients: SugarRoll.be Fact Sheet for Healthcare Providers: https://www.woods-mathews.com/ This test is not yet approved or cleared by the [place] FDA and  has been authorized for detection and/or diagnosis of SARS-CoV-2 by FDA under an  Emergency Use Authorization (EUA). This EUA will remain  in effect (meaning this test can be used) for the duration of the COVID-19 declaration under Section 56 4(b)(1) of the Act, 21 U.S.C. section 360bbb-3(b)(1), unless the authorization is terminated or revoked sooner. Performed at [facility], [place] [address] [place], [place] [address]      Total time spend on discharging this patient, including the last patient exam, discussing the hospital stay, instructions for ongoing care as it relates to all pertinent caregivers, as well as preparing the medical discharge records, prescriptions, and/or referrals as applicable, is 30 minutes.    Enzo Bi, MD  [facility] 08/20/2019, 1:55 AM  If 7PM-7AM, please contact night-coverage

## 2019-08-13 NOTE — Progress Notes (Signed)
*  PRELIMINARY RESULTS* Echocardiogram 2D Echocardiogram has been performed.  Paul Haas 08/13/2019, 2:22 PM

## 2019-08-13 NOTE — Consult Note (Signed)
Referring Physician: Billie Ruddy    Chief Complaint: Slurred speech  HPI: Paul Kanaris Sr. is an 79 y.o. male with a history of DM, HTN and HLD (noncomlpioant with medications) who reports that after eating lunch on 1/19 noted some lightheadedness ans slurred speech.  Slurred speech continued intermittently but did not completely resolve and patient presented for evaluation.  Initial NIHSS of 0.   Date last known well: 08/11/2019 Time last known well: Time: 12:00 tPA Given: No: Outside time window  Past Medical History:  Diagnosis Date  . Diabetes mellitus without complication ([place])   . Hyperlipidemia   . Hypertension     Past Surgical History:  Procedure Laterality Date  . FRACTURE SURGERY    . HERNIA REPAIR    . TONSILLECTOMY      Family history: Father deceased with prostate cancer and CHF.  Mother deceased with COPD.  Brother with DM.    Social History:  reports that he has never smoked. He has never used smokeless tobacco. He reports previous alcohol use. He reports previous drug use.  Allergies:  Allergies  Allergen Reactions  . Sulfa Antibiotics Hives and Rash    Pruritis, scalp tingles, whelps Other reaction(s): Other (See Comments) Pruritis, scalp tingles, whelps Other reaction(s): Other (See Comments) Pruritis, scalp tingles, whelps     Medications:  I have reviewed the patient's current medications. Prior to Admission:  Medications Prior to Admission  Medication Sig Dispense Refill Last Dose  . aspirin 81 MG EC tablet Take 81 mg by mouth daily.   08/12/2019 at 0800  . bicalutamide (CASODEX) 50 MG tablet Take 50 mg by mouth daily.   08/11/2019 at 1800  . dutasteride (AVODART) 0.5 MG capsule Take 0.5 mg by mouth daily.   08/11/2019 at 1800  . furosemide (LASIX) 40 MG tablet Take 40 mg by mouth daily as needed for fluid.   Past Week at prn  . glimepiride (AMARYL) 4 MG tablet Take 4 mg by mouth 2 (two) times daily.   08/12/2019 at 0800  . lisinopril (ZESTRIL) 5 MG  tablet Take 5 mg by mouth daily.   08/12/2019 at 0800  . metFORMIN (GLUCOPHAGE) 1000 MG tablet Take 1,000 mg by mouth 2 (two) times daily.   08/12/2019 at 0800  . Multiple Vitamin (MULTI-VITAMIN) tablet Take 1 tablet by mouth daily.   08/12/2019 at 0800  . pravastatin (PRAVACHOL) 40 MG tablet Take 40 mg by mouth at bedtime.   08/11/2019 at 2000  . tamsulosin (FLOMAX) 0.4 MG CAPS capsule Take 0.4 mg by mouth daily.   08/11/2019 at 1800   Scheduled: .  stroke: mapping our early stages of recovery book   Does not apply Once  . aspirin  300 mg Rectal Daily   Or  . aspirin EC  325 mg Oral Daily  . enoxaparin (LOVENOX) injection  40 mg Subcutaneous Q24H  . insulin aspart  0-20 Units Subcutaneous Q4H  . sodium chloride flush  3 mL Intravenous Once    ROS: History obtained from the patient  General ROS: negative for - chills, fatigue, fever, night sweats, weight gain or weight loss Psychological ROS: negative for - behavioral disorder, hallucinations, memory difficulties, mood swings or suicidal ideation Ophthalmic ROS: negative for - blurry vision, double vision, eye pain or loss of vision ENT ROS: negative for - epistaxis, nasal discharge, oral lesions, sore throat, tinnitus or vertigo Allergy and Immunology ROS: negative for - hives or itchy/watery eyes Hematological and Lymphatic ROS: negative for -  bleeding problems, bruising or swollen lymph nodes Endocrine ROS: negative for - galactorrhea, hair pattern changes, polydipsia/polyuria or temperature intolerance Respiratory ROS: negative for - cough, hemoptysis, shortness of breath or wheezing Cardiovascular ROS: negative for - chest pain, dyspnea on exertion, edema or irregular heartbeat Gastrointestinal ROS: negative for - abdominal pain, diarrhea, hematemesis, nausea/vomiting or stool incontinence Genito-Urinary ROS: negative for - dysuria, hematuria, incontinence or urinary frequency/urgency Musculoskeletal ROS: negative for - joint swelling  or muscular weakness Neurological ROS: as noted in HPI Dermatological ROS: negative for rash and skin lesion changes  Physical Examination: Blood pressure (!) 164/93, pulse 77, temperature 97.6 F (36.4 C), temperature source Oral, resp. rate 18, height 6' (1.829 m), weight 131.1 kg, SpO2 95 %.  HEENT-  Normocephalic, no lesions, without obvious abnormality.  Normal external eye and conjunctiva.  Normal TM's bilaterally.  Normal auditory canals and external ears. Normal external nose, mucus membranes and septum.  Normal pharynx. Cardiovascular- S1, S2 normal, pulses palpable throughout   Lungs- chest clear, no wheezing, rales, normal symmetric air entry Abdomen- soft, non-tender; bowel sounds normal; no masses,  no organomegaly Extremities- no edema Lymph-no adenopathy palpable Musculoskeletal-no joint tenderness, deformity or swelling Skin-warm and dry, no hyperpigmentation, vitiligo, or suspicious lesions  Neurological Examination   Mental Status: Alert, oriented, thought content appropriate.  Speech fluent without evidence of aphasia.  Able to follow 3 step commands without difficulty. Cranial Nerves: II: Visual fields grossly normal, pupils equal, round, reactive to light and accommodation III,IV, VI: ptosis not present, extra-ocular motions intact bilaterally V,VII: smile symmetric, facial light touch sensation normal bilaterally VIII: hearing normal bilaterally IX,X: gag reflex present XI: bilateral shoulder shrug XII: midline tongue extension Motor: Right : Upper extremity   5/5    Left:     Upper extremity   5/5  Lower extremity   5/5     Lower extremity   5/5 Tone and bulk:normal tone throughout; no atrophy noted Sensory: Pinprick and light touch intact throughout, bilaterally Deep Tendon Reflexes: Symmetric throughout Plantars: Right: mute   Left: mute Cerebellar: Mild dysmetria with right finger-to-nose testing.  Decreased rapid alternating movements on the  right Gait: not tested due to safety concerns    Laboratory Studies:  Basic Metabolic Panel: Recent Labs  Lab 08/12/19 1247 08/12/19 2119  NA 138  --   K 3.8  --   CL 105  --   CO2 24  --   GLUCOSE 164*  --   BUN 14  --   CREATININE 0.92 0.95  CALCIUM 9.3  --     Liver Function Tests: Recent Labs  Lab 08/12/19 1247  AST 15  ALT 17  ALKPHOS 47  BILITOT 0.8  PROT 7.5  ALBUMIN 4.2   No results for input(s): LIPASE, AMYLASE in the last 168 hours. No results for input(s): AMMONIA in the last 168 hours.  CBC: Recent Labs  Lab 08/12/19 1247  WBC 11.7*  NEUTROABS 7.7  HGB 15.7  HCT 47.0  MCV 91.6  PLT 294    Cardiac Enzymes: No results for input(s): CKTOTAL, CKMB, CKMBINDEX, TROPONINI in the last 168 hours.  BNP: Invalid input(s): POCBNP  CBG: Recent Labs  Lab 08/12/19 1253 08/12/19 2200 08/12/19 2336 08/13/19 0416 08/13/19 0816  GLUCAP 151* 159* 156* 111* 149*    Microbiology: Results for orders placed or performed during the hospital encounter of 08/12/19  SARS CORONAVIRUS 2 (TAT 6-24 HRS) Nasopharyngeal Nasopharyngeal Swab     Status: None   Collection  Time: 08/12/19  9:19 PM   Specimen: Nasopharyngeal Swab  Result Value Ref Range Status   SARS Coronavirus 2 NEGATIVE NEGATIVE Final    Comment: (NOTE) SARS-CoV-2 target nucleic acids are NOT DETECTED. The SARS-CoV-2 RNA is generally detectable in upper and lower respiratory specimens during the acute phase of infection. Negative results do not preclude SARS-CoV-2 infection, do not rule out co-infections with other pathogens, and should not be used as the sole basis for treatment or other patient management decisions. Negative results must be combined with clinical observations, patient history, and epidemiological information. The expected result is Negative. Fact Sheet for Patients: SugarRoll.be Fact Sheet for Healthcare  Providers: https://www.woods-mathews.com/ This test is not yet approved or cleared by the [place] FDA and  has been authorized for detection and/or diagnosis of SARS-CoV-2 by FDA under an Emergency Use Authorization (EUA). This EUA will remain  in effect (meaning this test can be used) for the duration of the COVID-19 declaration under Section 56 4(b)(1) of the Act, 21 U.S.C. section 360bbb-3(b)(1), unless the authorization is terminated or revoked sooner. Performed at [facility], [place] [address] [place], [place] [address]     Coagulation Studies: Recent Labs    08/12/19 1247  LABPROT 12.9  INR 1.0    Urinalysis: No results for input(s): COLORURINE, LABSPEC, PHURINE, GLUCOSEU, HGBUR, BILIRUBINUR, KETONESUR, PROTEINUR, UROBILINOGEN, NITRITE, LEUKOCYTESUR in the last 168 hours.  Invalid input(s): APPERANCEUR  Lipid Panel:    Component Value Date/Time   CHOL 176 08/13/2019 0439   TRIG 96 08/13/2019 0439   HDL 39 (L) 08/13/2019 0439   CHOLHDL 4.5 08/13/2019 0439   VLDL 19 08/13/2019 0439   LDLCALC 118 (H) 08/13/2019 0439    HgbA1C: No results found for: HGBA1C  Urine Drug Screen:  No results found for: LABOPIA, COCAINSCRNUR, LABBENZ, AMPHETMU, THCU, LABBARB  Alcohol Level: No results for input(s): ETH in the last 168 hours.  Other results: EKG: normal sinus rhythm at 83 bpm with RBBB.  Imaging: CT HEAD WO CONTRAST  Result Date: 08/12/2019 CLINICAL DATA:  Stroke-like symptoms with slurred speech for 1 day EXAM: CT HEAD WITHOUT CONTRAST TECHNIQUE: Contiguous axial images were obtained from the base of the skull through the vertex without intravenous contrast. COMPARISON:  08/16/2009 FINDINGS: Brain: Mild atrophic changes are noted commensurate with the patient's given age. No findings to suggest acute hemorrhage, acute infarction or space-occupying mass lesion are noted. Old lacunar infarct is noted within the thalamus on the left new from the prior  exam. Vascular: No hyperdense vessel or unexpected calcification. Skull: Normal. Negative for fracture or focal lesion. Sinuses/Orbits: No acute finding. Other: None. IMPRESSION: Chronic atrophic and ischemic changes.  No acute abnormality noted. Electronically Signed   By: Inez Catalina M.D.   On: 08/12/2019 13:09   MR BRAIN WO CONTRAST  Result Date: 08/12/2019 CLINICAL DATA:  Focal neuro deficit, greater than 6 hours, stroke suspected. Additional history provided by scanning technologist: Patient reports feeling "whoozy" and having slurred speech after lunch yesterday. Patient reports today having intermittent slurred speech. EXAM: MRI HEAD WITHOUT CONTRAST TECHNIQUE: Multiplanar, multiecho pulse sequences of the brain and surrounding structures were obtained without intravenous contrast. 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 Corresponding T2/FLAIR hyperintensity at this site. Mild scattered T2/FLAIR hyperintensity within the cerebral white matter is nonspecific, but consistent with chronic small vessel ischemic disease. Mild generalized  parenchymal atrophy. Tiny chronic lacunar infarct within the inferior right cerebellum. There is an extra-axial mass along the anterior right middle cranial fossa measuring 1.8 x 1.4 cm in transaxial dimensions (series 12, image 10). This demonstrates T1 and T2 intermediate signal and slight T2 FLAIR hyperintensity. Mild mass effect upon the underlying anterior right temporal lobe. This finding was not present on prior MRI 02/21/2010. No midline shift or extra-axial fluid collection. No chronic intracranial blood products. Vascular: Flow voids maintained within the proximal large arterial vessels. Skull and upper cervical spine: No focal marrow lesion. Sinuses/Orbits: Visualized orbits demonstrate no acute abnormality. Mild mucosal thickening within  the inferior maxillary sinuses. No significant mastoid effusion. 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 1.8 x 1.4 cm extra-axial mass within the anterior right middle cranial fossa as described. This finding was not present on prior MRI 02/21/2010. There is mild mass effect upon the underlying right temporal lobe without parenchymal signal abnormality. 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 Nonemergent contrast-enhanced brain MRI is recommended for further evaluation. Mild generalized parenchymal atrophy and chronic small vessel ischemic disease. Mild maxillary paranasal sinus mucosal thickening. Electronically Signed   By: Kellie Simmering DO   On: 08/12/2019 19:02    Assessment: 79 y.o. male with a history of HTN, DM and HLD who presents with a >24 hour history of slurred speech.  Patient on ASA daily.  Not compliant with statin.  MRI of the brain reviewed and shows an acute left thalamic infarct.  Etiology likely small vessel disease.  BP elevated.  Echocardiogram and carotid dopplers are pending.  LDL 118.    Stroke Risk Factors - diabetes mellitus, hyperlipidemia and hypertension  Plan: 1. HgbA1c pending.  Target <7.0.   2. PT consult, OT consult, Speech consult 3. Echocardiogram pending 4. Carotid dopplers pending 5. Prophylactic therapy-Dual antiplatelet therapy with ASA 81mg  and Plavix 75mg  for three weeks with change to Plavix 75mg  daily alone as monotherapy after that time. 6. NPO until RN stroke swallow screen 7. Telemetry monitoring 8. Frequent neuro checks 9. BP control with target BP<140/80 10. Statin for lipid management with target LDL<70.   Alexis Goodell, MD Neurology [phone] 08/13/2019, 10:04 AM

## 2019-08-13 NOTE — Progress Notes (Signed)
   08/12/19 2358  Vitals  Temp 97.6 F (36.4 C)  Temp Source Oral  BP (!) 145/89  MAP (mmHg) 106  BP Location Left Arm  BP Method Automatic  Patient Position (if appropriate) Lying  Pulse Rate 84  Pulse Rate Source Monitor  Resp 18  Oxygen Therapy  SpO2 94 %  O2 Device Room Air  MEWS Score  MEWS Temp 0  MEWS Systolic 0  MEWS Pulse 0  MEWS RR 0  MEWS LOC 0  MEWS Score 0  MEWS Score Color Green   The patient is admitted from ED to 1 A 153 with the diagnosis of acute thalamic infarct. The patient is A & O x 4. Denied any acute pain. Neurological assessments in progress and no deficit noted. The patient is oriented to his room,ascom/call bell and staff. Patient voice no concern. Full assessment to epic completed. Will continue to monitor.

## 2019-08-13 NOTE — Evaluation (Signed)
Occupational Therapy Evaluation Patient Details Name: Paul ZAPPONE Sr. MRN: [id number] DOB: 27-May-1941 Today's Date: 08/13/2019    History of Present Illness 79 y.o. male with medical history significant for obesity, hypertension, type 2 diabetes with neuropathy and sleep apnea. Per pt he reports previously Covid-19+ (Sept 2020, lost sense of taste/smell, no other symptoms). Covid-19 test from 08/12/19 negative. Pt presented to the emergency room on 08/12/19 with a complaint of slurred speech that started around 1 PM the day prior.  He denied headache, visual changes and denied numbness weakness of the face or extremities. Imaging + for acute L thalamic infarct.   Clinical Impression   Pt seen for OT evaluation this date. Prior to hospital admission, pt was independent in all aspects of ADL/IADL, working (primarily desk work), and denies falls history in past 12 months. Pt lives with his spouse in a 2 story home with level entry and bed/bath on main floor. Currently pt presents with mild impairments in balance and speech. No strength, sensory, coordination, cognitive, or visual deficits appreciated with assessment. Pt mildly impulsive with functional mobility tasks requiring CGA for mobility, no AD needed. Pt utilized grab bar when standing at toilet and also noted to reach out with fingertips on furniture and walls in room. Pt educated in falls prevention, s/s of stroke, and LB ADL strategies to improve independence/safety. Pt verbalized understanding. Do not anticipate need for additional skilled OT services at this time. Will sign off. Please re-consult if additional OT needs arise.    Follow Up Recommendations  No OT follow up    Equipment Recommendations  None recommended by OT(encouraged pt to consider reacher, sock aide, long handled shoe horn to make LB ADL easier)    Recommendations for Other Services       Precautions / Restrictions Precautions Precautions:  Fall Restrictions Weight Bearing Restrictions: No      Mobility Bed Mobility Overal bed mobility: Modified Independent                Transfers Overall transfer level: Needs assistance Equipment used: None Transfers: Sit to/from Stand Sit to Stand: Min guard         General transfer comment: mildly impulsive with initial OOB attempt, denies dizziness, reaches for bed rail initially but denies it's for stability    Balance Overall balance assessment: Needs assistance Sitting-balance support: No upper extremity supported;Feet supported Sitting balance-Leahy Scale: Normal     Standing balance support: During functional activity;No upper extremity supported Standing balance-Leahy Scale: Fair Standing balance comment: mild balance deficits noted with dynamic balance activities                           ADL either performed or assessed with clinical judgement   ADL Overall ADL's : Modified independent                                       General ADL Comments: mild balance deficits noted with sit to stand and standing ADL tasks requiring CGA to supervision during assessment; pt performed LB dressing from seated position with supervision, CGA for standing toileting task with pt reaching out for grab bar for stability     Vision Baseline Vision/History: Wears glasses Wears Glasses: Reading only Patient Visual Report: No change from baseline Vision Assessment?: No apparent visual deficits     Perception  Praxis      Pertinent Vitals/Pain Pain Assessment: No/denies pain     Hand Dominance Right   Extremity/Trunk Assessment Upper Extremity Assessment Upper Extremity Assessment: Overall WFL for tasks assessed(no sensory, strength or coordination deficits appreciated with testing)   Lower Extremity Assessment Lower Extremity Assessment: Overall WFL for tasks assessed(no sensory, strength or coordination deficits appreciated with  testing)   Cervical / Trunk Assessment Cervical / Trunk Assessment: Normal   Communication Communication Communication: Expressive difficulties(no difficulties at baseline; currently demo's very slightly slurred speech when talking quickly)   Cognition Arousal/Alertness: Awake/alert Behavior During Therapy: WFL for tasks assessed/performed Overall Cognitive Status: Within Functional Limits for tasks assessed                                 General Comments: mildly impulsive with mobility tasks   General Comments       Exercises Other Exercises Other Exercises: Pt educated in s/s of stroke and strategies to improve independence with LB dressing and minimize falls risks   Shoulder Instructions      Home Living Family/patient expects to be discharged to:: Private residence Living Arrangements: Spouse/significant other Available Help at Discharge: Family;Available 24 hours/day Type of Home: House Home Access: Level entry     Home Layout: Two level;Able to live on main level with bedroom/bathroom     Bathroom Shower/Tub: Tub/shower unit;Walk-in shower   Bathroom Toilet: Handicapped height     Home Equipment: Other (comment)   Additional Comments: he has a walking stick      Prior Functioning/Environment Level of Independence: Independent        Comments: Pt independent in all aspects, working (desk work), 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        OT Problem List: Impaired balance (sitting and/or standing)      OT Treatment/Interventions:      OT Goals(Current goals can be found in the care plan section) Acute Rehab OT Goals Patient Stated Goal: go home OT Goal Formulation: All assessment and education complete, DC therapy  OT Frequency:     Barriers to D/C:            Co-evaluation              AM-PAC OT "6 Clicks" Daily Activity     Outcome Measure Help from another person eating meals?: None Help from another person taking care of personal grooming?:  None Help from another person toileting, which includes using toliet, bedpan, or urinal?: A Little Help from another person bathing (including washing, rinsing, drying)?: A Little Help from another person to put on and taking off regular upper body clothing?: None Help from another person to put on and taking off regular lower body clothing?: None 6 Click Score: 22   End of Session Equipment Utilized During Treatment: Gait belt  Activity Tolerance: Patient tolerated treatment well Patient left: in chair;with call bell/phone within reach;with chair alarm set  OT Visit Diagnosis: Other abnormalities of gait and mobility (R26.89)                Time: [id number] OT Time Calculation (min): 23 min Charges:  OT General Charges $OT Visit: 1 Visit OT Evaluation $OT Eval Low Complexity: 1 Low OT Treatments $Self Care/Home Management : 8-22 mins  Jeni Salles, MPH, MS, OTR/L ascom [phone] 08/13/19, 9:53 AM

## 2019-08-14 LAB — ECHOCARDIOGRAM COMPLETE
Height: 72 in
Weight: 4624 [oz_av]

## 2019-09-15 ENCOUNTER — Other Ambulatory Visit: Payer: Self-pay | Admitting: Acute Care

## 2019-09-15 ENCOUNTER — Other Ambulatory Visit (HOSPITAL_COMMUNITY): Payer: Self-pay | Admitting: Acute Care

## 2019-09-15 DIAGNOSIS — Z8673 Personal history of transient ischemic attack (TIA), and cerebral infarction without residual deficits: Secondary | ICD-10-CM

## 2019-09-15 DIAGNOSIS — D329 Benign neoplasm of meninges, unspecified: Secondary | ICD-10-CM

## 2019-09-24 ENCOUNTER — Ambulatory Visit: Payer: Medicare Other

## 2021-07-10 ENCOUNTER — Other Ambulatory Visit: Payer: Self-pay | Admitting: Urology

## 2021-07-10 ENCOUNTER — Ambulatory Visit
Admission: RE | Admit: 2021-07-10 | Discharge: 2021-07-10 | Disposition: A | Discharge status: Home / Self Care | Payer: Medicare Other | Source: Ambulatory Visit | Attending: Urology | Admitting: Urology

## 2021-07-10 DIAGNOSIS — N200 Calculus of kidney: Secondary | ICD-10-CM | POA: Diagnosis present

## 2021-07-10 LAB — POCT I-STAT CREATININE: Creatinine, Ser: 2 mg/dL — ABNORMAL HIGH (ref 0.61–1.24)

## 2021-07-10 MED ORDER — IOHEXOL 300 MG/ML  SOLN
100.0000 mL | Freq: Once | INTRAMUSCULAR | Status: AC | PRN
  Administered 2021-07-10: 12:00:00 100 mL via INTRAVENOUS

## 2021-07-11 NOTE — H&P (Signed)
NAME: Paul Haas, DOMANGUE MEDICAL RECORD NO: [id number] ACCOUNT NO: [id number] DATE OF BIRTH: 1940-12-29 FACILITY: [facility] LOCATION: [facility] PHYSICIAN: Otelia Limes. Yves Dill, MD  History and Physical   DATE OF ADMISSION: 07/13/2021  Same day surgery 07/13/2021  CHIEF COMPLAINT:  Right flank pain and hematuria.  HISTORY OF PRESENT ILLNESS:  The patient is a 80 year old white male with sudden onset of right flank pain associated with nausea on December the 11th.  He underwent CT scan on 07/10/2021 indicating right sided distal 5 mm stone with hydronephrosis.  He  also had bilateral small renal calculi.  He was found to have an elevated creatinine of 2.0.  He comes in now for right retrograde with right ureteroscopic ureterolithotomy and with holmium laser lithotripsy and stent placement.  PAST MEDICAL HISTORY: ALLERGIES:  INCLUDE SULFA DRUGS.  CURRENT MEDICATIONS:  Included metformin, glimepiride, lisinopril, clopidogrel, Lasix and aspirin.  PAST SURGICAL HISTORY:  Tonsillectomy, right rotator cuff repair and umbilical herniorrhaphy.  SOCIAL HISTORY:  Denied tobacco use.  Consumes 1-4 alcoholic beverages per week.  FAMILY HISTORY:  Father died of prostate cancer.  There is also a family history of heart disease.  PAST AND CURRENT MEDICAL CONDITIONS: 1.  Diabetes. 2.  Prostate cancer -- currently being managed with active surveillance. 3.  Unspecified heart disease. 4.  Erectile dysfunction. 5.  BPH. 6.  Hyperlipidemia. 7.  Sleep apnea.  REVIEW OF SYSTEMS:  The patient denied chest pain or stroke.  He denied shortness of breath.  PHYSICAL EXAMINATION:   VITAL SIGNS:  Height was 5 feet 9 inches, weight 269 pounds, BMI 40. GENERAL:  Obese white male in no acute distress. HEENT:  Sclerae were clear.  Pupils are equally round, reactive to light and accommodation. NECK:  No palpable cervical masses.  No audible carotid bruits. LYMPHATIC:  No palpable cervical or inguinal  adenopathy. LUNGS:  Clear to auscultation. CARDIOVASCULAR:  Regular rhythm and rate. ABDOMEN:  Soft and nontender abdomen.  No CVA tenderness. GENITOURINARY: Circumcised.  Testes smooth and nontender, 20 mL size each.  Rectal exam 40 gram smooth, nontender prostate. NEUROLOGIC:  Alert and oriented x3.  IMPRESSION:  1.  Right distal 5 mm stone with hydronephrosis and elevated creatinine. 2.  Prostate cancer. 3.  Obesity. 4.  Renal colic.  PLAN:  Right retrograde pyelography, right ureteroscopic ureterolithotomy with holmium laser lithotripsy and stent placement.   PUS D: 07/10/2021 2:32:41 pm T: 07/10/2021 2:51:00 pm  JOB: [id number]/ [id number]

## 2021-07-12 ENCOUNTER — Other Ambulatory Visit: Payer: Self-pay

## 2021-07-12 ENCOUNTER — Other Ambulatory Visit
Admission: RE | Admit: 2021-07-12 | Discharge: 2021-07-12 | Disposition: A | Discharge status: Home / Self Care | Payer: Medicare Other | Source: Ambulatory Visit | Attending: Urology | Admitting: Urology

## 2021-07-12 DIAGNOSIS — Z01818 Encounter for other preprocedural examination: Secondary | ICD-10-CM | POA: Diagnosis not present

## 2021-07-12 DIAGNOSIS — I10 Essential (primary) hypertension: Secondary | ICD-10-CM

## 2021-07-12 DIAGNOSIS — Z01810 Encounter for preprocedural cardiovascular examination: Secondary | ICD-10-CM | POA: Diagnosis not present

## 2021-07-12 HISTORY — DX: Unspecified osteoarthritis, unspecified site: M19.90

## 2021-07-12 HISTORY — DX: Malignant (primary) neoplasm, unspecified: C80.1

## 2021-07-12 HISTORY — DX: Nausea with vomiting, unspecified: Z98.890

## 2021-07-12 HISTORY — DX: Gastro-esophageal reflux disease without esophagitis: K21.9

## 2021-07-12 HISTORY — DX: Personal history of urinary calculi: Z87.442

## 2021-07-12 HISTORY — DX: Cerebral infarction, unspecified: I63.9

## 2021-07-12 LAB — BASIC METABOLIC PANEL
Anion gap: 8 (ref 5–15)
BUN: 19 mg/dL (ref 8–23)
CO2: 23 mmol/L (ref 22–32)
Calcium: 8.9 mg/dL (ref 8.9–10.3)
Chloride: 106 mmol/L (ref 98–111)
Creatinine, Ser: 1.74 mg/dL — ABNORMAL HIGH (ref 0.61–1.24)
GFR, Estimated: 40 mL/min — ABNORMAL LOW (ref >60)
Glucose, Bld: 126 mg/dL — ABNORMAL HIGH (ref 70–99)
Potassium: 3.9 mmol/L (ref 3.5–5.1)
Sodium: 137 mmol/L (ref 135–145)

## 2021-07-12 LAB — CBC
HCT: 42.2 % (ref 39.0–52.0)
Hemoglobin: 14.6 g/dL (ref 13.0–17.0)
MCH: 33.3 pg (ref 26.0–34.0)
MCHC: 34.6 g/dL (ref 30.0–36.0)
MCV: 96.1 fL (ref 80.0–100.0)
Platelets: 344 10*3/uL (ref 150–400)
RBC: 4.39 MIL/uL (ref 4.22–5.81)
RDW: 12.1 % (ref 11.5–15.5)
WBC: 9.3 10*3/uL (ref 4.0–10.5)
nRBC: 0 % (ref 0.0–0.2)

## 2021-07-12 NOTE — Patient Instructions (Addendum)
Your procedure is scheduled on: 07/13/21 Report to the Registration Desk on the 1st floor of the [facility]. To find out your arrival time, please call [phone] between 1PM - 3PM on: 07/12/21  REMEMBER: Instructions that are not followed completely may result in serious medical risk, up to and including death; or upon the discretion of your surgeon and anesthesiologist your surgery may need to be rescheduled.  Do not eat food after midnight the night before surgery.  No gum chewing, lozengers or hard candies.  You may however, drink CLEAR liquids up to 2 hours before you are scheduled to arrive for your surgery. Do not drink anything within 2 hours of your scheduled arrival time.  Clear liquids include: - water  Type 1 and Type 2 diabetics should only drink water.  TAKE THESE MEDICATIONS THE MORNING OF SURGERY WITH A SIP OF WATER:  - omeprazole (PRILOSEC) 40 MG capsule, (take one the night before and one on the morning of surgery - helps to prevent nausea after surgery.) - tamsulosin (FLOMAX) 0.4 MG CAPS capsule   - MetFORMIN (GLUCOPHAGE) 1000 MG tablet do not take 12/21, and do not take the day of surgery.  Follow recommendations from Cardiologist, Pulmonologist or PCP regarding stopping Aspirin, Coumadin, Plavix, Eliquis, Pradaxa, or Pletal. Patient reports last dose of Plavix and Aspirin taken on 07/10/21.  One week prior to surgery: Stop Anti-inflammatories (NSAIDS) such as Advil, Aleve, Ibuprofen, Motrin, Naproxen, Naprosyn and Aspirin based products such as Excedrin, Goodys Powder, BC Powder.  Stop ANY OVER THE COUNTER supplements until after surgery.  You may however, continue to take Tylenol if needed for pain up until the day of surgery.  No Alcohol for 24 hours before or after surgery.  No Smoking including e-cigarettes for 24 hours prior to surgery.  No chewable tobacco products for at least 6 hours prior to surgery.  No nicotine patches on the day of  surgery.  Do not use any "recreational" drugs for at least a week prior to your surgery.  Please be advised that the combination of cocaine and anesthesia may have negative outcomes, up to and including death. If you test positive for cocaine, your surgery will be cancelled.  On the morning of surgery brush your teeth with toothpaste and water, you may rinse your mouth with mouthwash if you wish. Do not swallow any toothpaste or mouthwash.  Take a Shower/Bath the day of procedure, you may apply deodorant.  Do not wear jewelry, make-up, hairpins, clips or nail polish.  Do not wear lotions, powders, or perfumes.   Do not shave body from the neck down 48 hours prior to surgery just in case you cut yourself which could leave a site for infection.  Also, freshly shaved skin may become irritated if using the CHG soap.  Contact lenses, hearing aids and dentures may not be worn into surgery.  Do not bring valuables to the hospital. [facility] is not responsible for any missing/lost belongings or valuables.   Notify your doctor if there is any change in your medical condition (cold, fever, infection).  Wear comfortable clothing (specific to your surgery type) to the hospital.  After surgery, you can help prevent lung complications by doing breathing exercises.  Take deep breaths and cough every 1-2 hours. Your doctor may order a device called an Incentive Spirometer to help you take deep breaths. When coughing or sneezing, hold a pillow firmly against your incision with both hands. This is called splinting. Doing this  helps protect your incision. It also decreases belly discomfort.  If you are being admitted to the hospital overnight, leave your suitcase in the car. After surgery it may be brought to your room.  If you are being discharged the day of surgery, you will not be allowed to drive home. You will need a responsible adult (18 years or older) to drive you home and stay with you  that night.   If you are taking public transportation, you will need to have a responsible adult (18 years or older) with you. Please confirm with your physician that it is acceptable to use public transportation.   Please call the [facility] at [phone] if you have any questions about these instructions.  Surgery Visitation Policy:  Patients undergoing a surgery or procedure may have one family member or support person with them as long as that person is not COVID-19 positive or experiencing its symptoms.  That person may remain in the waiting area during the procedure and may rotate out with other people.  Inpatient Visitation:    Visiting hours are 7 a.m. to 8 p.m. Up to two visitors ages 16+ are allowed at one time in a patient room. The visitors may rotate out with other people during the day. Visitors must check out when they leave, or other visitors will not be allowed. One designated support person may remain overnight. The visitor must pass COVID-19 screenings, use hand sanitizer when entering and exiting the patients room and wear a mask at all times, including in the patients room. Patients must also wear a mask when staff or their visitor are in the room. Masking is required regardless of vaccination status.

## 2021-07-13 ENCOUNTER — Ambulatory Visit: Payer: Medicare Other

## 2021-07-13 ENCOUNTER — Ambulatory Visit
Admission: RE | Admit: 2021-07-13 | Discharge: 2021-07-13 | Disposition: A | Discharge status: Home / Self Care | Payer: Medicare Other | Source: Ambulatory Visit | Attending: Urology | Admitting: Urology

## 2021-07-13 ENCOUNTER — Encounter
Admission: RE | Disposition: A | Discharge status: Home / Self Care | Payer: Self-pay | Source: Ambulatory Visit | Attending: Urology

## 2021-07-13 ENCOUNTER — Other Ambulatory Visit: Payer: Self-pay

## 2021-07-13 ENCOUNTER — Ambulatory Visit: Payer: Medicare Other | Admitting: Anesthesiology

## 2021-07-13 ENCOUNTER — Ambulatory Visit: Payer: Medicare Other | Admitting: Urgent Care

## 2021-07-13 ENCOUNTER — Encounter: Payer: Self-pay | Admitting: Urology

## 2021-07-13 DIAGNOSIS — N400 Enlarged prostate without lower urinary tract symptoms: Secondary | ICD-10-CM | POA: Insufficient documentation

## 2021-07-13 DIAGNOSIS — R112 Nausea with vomiting, unspecified: Secondary | ICD-10-CM | POA: Diagnosis not present

## 2021-07-13 DIAGNOSIS — N35819 Other urethral stricture, male, unspecified site: Secondary | ICD-10-CM

## 2021-07-13 DIAGNOSIS — C61 Malignant neoplasm of prostate: Secondary | ICD-10-CM | POA: Diagnosis not present

## 2021-07-13 DIAGNOSIS — Z7984 Long term (current) use of oral hypoglycemic drugs: Secondary | ICD-10-CM | POA: Diagnosis not present

## 2021-07-13 DIAGNOSIS — N132 Hydronephrosis with renal and ureteral calculous obstruction: Secondary | ICD-10-CM | POA: Insufficient documentation

## 2021-07-13 DIAGNOSIS — N35812 Other urethral bulbous stricture, male: Secondary | ICD-10-CM | POA: Diagnosis not present

## 2021-07-13 DIAGNOSIS — Z6838 Body mass index (BMI) 38.0-38.9, adult: Secondary | ICD-10-CM | POA: Insufficient documentation

## 2021-07-13 DIAGNOSIS — E119 Type 2 diabetes mellitus without complications: Secondary | ICD-10-CM | POA: Insufficient documentation

## 2021-07-13 DIAGNOSIS — E669 Obesity, unspecified: Secondary | ICD-10-CM | POA: Insufficient documentation

## 2021-07-13 DIAGNOSIS — N201 Calculus of ureter: Secondary | ICD-10-CM

## 2021-07-13 HISTORY — PX: CYSTOSCOPY WITH STENT PLACEMENT: SHX5790

## 2021-07-13 HISTORY — PX: URETEROSCOPY WITH HOLMIUM LASER LITHOTRIPSY: SHX6645

## 2021-07-13 LAB — GLUCOSE, CAPILLARY
Glucose-Capillary: 110 mg/dL — ABNORMAL HIGH (ref 70–99)
Glucose-Capillary: 102 mg/dL — ABNORMAL HIGH (ref 70–99)

## 2021-07-13 SURGERY — URETEROSCOPY, WITH LITHOTRIPSY USING HOLMIUM LASER
Anesthesia: General | Laterality: Right

## 2021-07-13 MED ORDER — CHLORHEXIDINE GLUCONATE 0.12 % MT SOLN: 15.0000 mL | Freq: Once | OROMUCOSAL | Status: AC

## 2021-07-13 MED ORDER — ROCURONIUM BROMIDE 10 MG/ML (PF) SYRINGE
PREFILLED_SYRINGE | INTRAVENOUS | Status: AC
  Filled 2021-07-13: qty 10

## 2021-07-13 MED ORDER — PROPOFOL 10 MG/ML IV BOLUS
INTRAVENOUS | Status: DC | PRN
  Administered 2021-07-13: 180 mg via INTRAVENOUS

## 2021-07-13 MED ORDER — IOHEXOL 180 MG/ML  SOLN
INTRAMUSCULAR | Status: DC | PRN
  Administered 2021-07-13: 13:00:00 20 mL

## 2021-07-13 MED ORDER — SUGAMMADEX SODIUM 500 MG/5ML IV SOLN
INTRAVENOUS | Status: AC
  Filled 2021-07-13: qty 5

## 2021-07-13 MED ORDER — ONDANSETRON HCL 4 MG/2ML IJ SOLN
INTRAMUSCULAR | Status: DC | PRN
  Administered 2021-07-13: 4 mg via INTRAVENOUS

## 2021-07-13 MED ORDER — CEFAZOLIN SODIUM-DEXTROSE 1-4 GM/50ML-% IV SOLN
INTRAVENOUS | Status: AC
  Filled 2021-07-13: qty 50

## 2021-07-13 MED ORDER — ACETAMINOPHEN 10 MG/ML IV SOLN
INTRAVENOUS | Status: AC
  Filled 2021-07-13: qty 100

## 2021-07-13 MED ORDER — APREPITANT 40 MG PO CAPS
ORAL_CAPSULE | ORAL | Status: AC
  Administered 2021-07-13: 11:00:00 40 mg via ORAL
  Filled 2021-07-13: qty 1

## 2021-07-13 MED ORDER — LIDOCAINE HCL (PF) 2 % IJ SOLN
INTRAMUSCULAR | Status: AC
  Filled 2021-07-13: qty 5

## 2021-07-13 MED ORDER — FENTANYL CITRATE (PF) 100 MCG/2ML IJ SOLN
25.0000 ug | INTRAMUSCULAR | Status: DC | PRN
  Administered 2021-07-13 (×3): 25 ug via INTRAVENOUS

## 2021-07-13 MED ORDER — LACTATED RINGERS IV SOLN: INTRAVENOUS | Status: DC

## 2021-07-13 MED ORDER — PHENYLEPHRINE HCL (PRESSORS) 10 MG/ML IV SOLN
INTRAVENOUS | Status: DC | PRN
  Administered 2021-07-13: 160 ug via INTRAVENOUS
  Administered 2021-07-13: 80 ug via INTRAVENOUS

## 2021-07-13 MED ORDER — LIDOCAINE HCL URETHRAL/MUCOSAL 2 % EX GEL
CUTANEOUS | Status: AC
  Filled 2021-07-13: qty 10

## 2021-07-13 MED ORDER — LIDOCAINE HCL (CARDIAC) PF 100 MG/5ML IV SOSY
PREFILLED_SYRINGE | INTRAVENOUS | Status: DC | PRN
  Administered 2021-07-13: 100 mg via INTRAVENOUS

## 2021-07-13 MED ORDER — EPHEDRINE SULFATE 50 MG/ML IJ SOLN
INTRAMUSCULAR | Status: DC | PRN
  Administered 2021-07-13: 10 mg via INTRAVENOUS
  Administered 2021-07-13: 5 mg via INTRAVENOUS

## 2021-07-13 MED ORDER — APREPITANT 40 MG PO CAPS: 40.0000 mg | ORAL_CAPSULE | Freq: Once | ORAL | Status: AC

## 2021-07-13 MED ORDER — CIPROFLOXACIN HCL 500 MG PO TABS: 500.0000 mg | ORAL_TABLET | Freq: Two times a day (BID) | ORAL | 0 refills | Status: DC

## 2021-07-13 MED ORDER — LIDOCAINE HCL URETHRAL/MUCOSAL 2 % EX GEL
CUTANEOUS | Status: DC | PRN
  Administered 2021-07-13: 1 via URETHRAL

## 2021-07-13 MED ORDER — ONDANSETRON HCL 4 MG/2ML IJ SOLN
INTRAMUSCULAR | Status: AC
  Filled 2021-07-13: qty 2

## 2021-07-13 MED ORDER — SODIUM CHLORIDE 0.9 % IV SOLN: INTRAVENOUS | Status: DC

## 2021-07-13 MED ORDER — DEXAMETHASONE SODIUM PHOSPHATE 10 MG/ML IJ SOLN
INTRAMUSCULAR | Status: DC | PRN
  Administered 2021-07-13: 5 mg via INTRAVENOUS

## 2021-07-13 MED ORDER — MEPERIDINE HCL 25 MG/ML IJ SOLN
6.2500 mg | INTRAMUSCULAR | Status: DC | PRN
Start: 2021-07-13 — End: 2021-07-13

## 2021-07-13 MED ORDER — CHLORHEXIDINE GLUCONATE 0.12 % MT SOLN
OROMUCOSAL | Status: AC
  Administered 2021-07-13: 11:00:00 15 mL via OROMUCOSAL
  Filled 2021-07-13: qty 15

## 2021-07-13 MED ORDER — BELLADONNA ALKALOIDS-OPIUM 16.2-60 MG RE SUPP
RECTAL | Status: AC
  Filled 2021-07-13: qty 1

## 2021-07-13 MED ORDER — ORAL CARE MOUTH RINSE: 15.0000 mL | Freq: Once | OROMUCOSAL | Status: AC

## 2021-07-13 MED ORDER — HYDROCODONE-ACETAMINOPHEN 10-325 MG PO TABS: 1.0000 | ORAL_TABLET | ORAL | 0 refills | Status: DC | PRN

## 2021-07-13 MED ORDER — SUGAMMADEX SODIUM 500 MG/5ML IV SOLN
INTRAVENOUS | Status: DC | PRN
  Administered 2021-07-13: 500 mg via INTRAVENOUS

## 2021-07-13 MED ORDER — FENTANYL CITRATE (PF) 100 MCG/2ML IJ SOLN
INTRAMUSCULAR | Status: AC
  Filled 2021-07-13: qty 2

## 2021-07-13 MED ORDER — URIBEL 118 MG PO CAPS: 1.0000 | ORAL_CAPSULE | Freq: Four times a day (QID) | ORAL | 3 refills | Status: DC | PRN

## 2021-07-13 MED ORDER — CEFAZOLIN SODIUM-DEXTROSE 1-4 GM/50ML-% IV SOLN
1.0000 g | Freq: Once | INTRAVENOUS | Status: AC
  Administered 2021-07-13: 12:00:00 2 g via INTRAVENOUS

## 2021-07-13 MED ORDER — ACETAMINOPHEN 10 MG/ML IV SOLN
INTRAVENOUS | Status: DC | PRN
  Administered 2021-07-13: 1000 mg via INTRAVENOUS

## 2021-07-13 MED ORDER — DEXAMETHASONE SODIUM PHOSPHATE 10 MG/ML IJ SOLN
INTRAMUSCULAR | Status: AC
  Filled 2021-07-13: qty 1

## 2021-07-13 MED ORDER — SODIUM CHLORIDE 0.9 % IR SOLN
Status: DC | PRN
  Administered 2021-07-13: 3000 mL via INTRAVESICAL

## 2021-07-13 MED ORDER — PHENYLEPHRINE HCL-NACL 20-0.9 MG/250ML-% IV SOLN
INTRAVENOUS | Status: DC | PRN
  Administered 2021-07-13: 50 ug/min via INTRAVENOUS

## 2021-07-13 MED ORDER — BELLADONNA ALKALOIDS-OPIUM 16.2-60 MG RE SUPP
RECTAL | Status: DC | PRN
  Administered 2021-07-13: 1 via RECTAL

## 2021-07-13 MED ORDER — ONDANSETRON HCL 4 MG/2ML IJ SOLN: 4.0000 mg | Freq: Once | INTRAMUSCULAR | Status: DC | PRN

## 2021-07-13 MED ORDER — PROPOFOL 500 MG/50ML IV EMUL
INTRAVENOUS | Status: DC | PRN
  Administered 2021-07-13: 150 ug/kg/min via INTRAVENOUS

## 2021-07-13 MED ORDER — ROCURONIUM BROMIDE 100 MG/10ML IV SOLN
INTRAVENOUS | Status: DC | PRN
  Administered 2021-07-13: 60 mg via INTRAVENOUS
  Administered 2021-07-13: 10 mg via INTRAVENOUS

## 2021-07-13 MED ORDER — PROPOFOL 500 MG/50ML IV EMUL
INTRAVENOUS | Status: AC
  Filled 2021-07-13: qty 50

## 2021-07-13 MED ORDER — FENTANYL CITRATE (PF) 100 MCG/2ML IJ SOLN
INTRAMUSCULAR | Status: AC
  Administered 2021-07-13: 14:00:00 25 ug via INTRAVENOUS
  Filled 2021-07-13: qty 2

## 2021-07-13 MED ORDER — FENTANYL CITRATE (PF) 100 MCG/2ML IJ SOLN
INTRAMUSCULAR | Status: DC | PRN
  Administered 2021-07-13: 50 ug via INTRAVENOUS

## 2021-07-13 SURGICAL SUPPLY — 33 items
BAG DRAIN CYSTO-URO LG1000N (MISCELLANEOUS) ×2 IMPLANT
BRUSH SCRUB EZ 1% IODOPHOR (MISCELLANEOUS) ×2 IMPLANT
CATH URETL OPEN 5X70 (CATHETERS) ×2 IMPLANT
CNTNR SPEC 2.5X3XGRAD LEK (MISCELLANEOUS)
CONT SPEC 4OZ STER OR WHT (MISCELLANEOUS)
CONT SPEC 4OZ STRL OR WHT (MISCELLANEOUS)
CONTAINER SPEC 2.5X3XGRAD LEK (MISCELLANEOUS) IMPLANT
FIBER LASER MOSES 365 DFL (Laser) ×1 IMPLANT
GAUZE 4X4 16PLY ~~LOC~~+RFID DBL (SPONGE) ×4 IMPLANT
GLOVE SURG ENC MOIS LTX SZ7 (GLOVE) ×4 IMPLANT
GLOVE SURG ENC MOIS LTX SZ7.5 (GLOVE) ×2 IMPLANT
GOWN STRL REUS W/ TWL LRG LVL3 (GOWN DISPOSABLE) ×1 IMPLANT
GOWN STRL REUS W/ TWL LRG LVL4 (GOWN DISPOSABLE) ×1 IMPLANT
GOWN STRL REUS W/ TWL XL LVL3 (GOWN DISPOSABLE) ×1 IMPLANT
GOWN STRL REUS W/TWL LRG LVL3 (GOWN DISPOSABLE) ×2
GOWN STRL REUS W/TWL LRG LVL4 (GOWN DISPOSABLE) ×2
GOWN STRL REUS W/TWL XL LVL3 (GOWN DISPOSABLE) ×2
GUIDEWIRE STR ZIPWIRE 035X150 (MISCELLANEOUS) ×2 IMPLANT
IV NS IRRIG 3000ML ARTHROMATIC (IV SOLUTION) ×2 IMPLANT
KIT BALLN UROMAX 15FX4 (MISCELLANEOUS) IMPLANT
KIT BALLN UROMAX 26 75X4 (MISCELLANEOUS) ×1
KIT TURNOVER CYSTO (KITS) ×2 IMPLANT
MANIFOLD NEPTUNE II (INSTRUMENTS) ×2 IMPLANT
PACK CYSTO AR (MISCELLANEOUS) ×2 IMPLANT
SET CYSTO W/LG BORE CLAMP LF (SET/KITS/TRAYS/PACK) ×2 IMPLANT
SOL PREP PVP 2OZ (MISCELLANEOUS) ×2
SOLUTION PREP PVP 2OZ (MISCELLANEOUS) ×1 IMPLANT
STENT URET 6FRX24 CONTOUR (STENTS) ×1 IMPLANT
STENT URET 6FRX26 CONTOUR (STENTS) ×1 IMPLANT
SURGILUBE 2OZ TUBE FLIPTOP (MISCELLANEOUS) ×2 IMPLANT
SYR 10ML LL (SYRINGE) ×2 IMPLANT
WATER STERILE IRR 1000ML POUR (IV SOLUTION) ×2 IMPLANT
WATER STERILE IRR 500ML POUR (IV SOLUTION) ×2 IMPLANT

## 2021-07-13 NOTE — Anesthesia Preprocedure Evaluation (Signed)
Anesthesia Evaluation  Patient identified by MRN, date of birth, ID band Patient awake    Reviewed: Allergy & Precautions, NPO status , Patient's Chart, lab work & pertinent test results  History of Anesthesia Complications (+) PONV and history of anesthetic complications  Airway Mallampati: III  TM Distance: >3 FB Neck ROM: Full    Dental  (+) Implants   Pulmonary sleep apnea ,    Pulmonary exam normal        Cardiovascular hypertension, Pt. on medications negative cardio ROS Normal cardiovascular exam     Neuro/Psych  Neuromuscular disease CVA, No Residual Symptoms negative psych ROS   GI/Hepatic Neg liver ROS, GERD  Medicated and Controlled,  Endo/Other  negative endocrine ROSdiabetes, Well Controlled, Oral Hypoglycemic Agents  Renal/GU negative Renal ROS  negative genitourinary   Musculoskeletal  (+) Arthritis , Osteoarthritis,    Abdominal   Peds negative pediatric ROS (+)  Hematology negative hematology ROS (+)   Anesthesia Other Findings Arthritis    Cancer (HCC)    Diabetes mellitus without complication (HCC)  GERD (gastroesophageal reflux disease)   History of kidney stones    Hyperlipidemia    Hypertension    PONV (postoperative nausea and vomiting)   Stroke (HCC)       Reproductive/Obstetrics negative OB ROS                             Anesthesia Physical Anesthesia Plan  ASA: 3  Anesthesia Plan: General   Post-op Pain Management:    Induction: Intravenous  PONV Risk Score and Plan: 2 and Ondansetron, Propofol infusion and Midazolam  Airway Management Planned: Oral ETT  Additional Equipment:   Intra-op Plan:   Post-operative Plan:   Informed Consent: I have reviewed the patients History and Physical, chart, labs and discussed the procedure including the risks, benefits and alternatives for the proposed anesthesia with the patient or authorized  representative who has indicated his/her understanding and acceptance.       Plan Discussed with: CRNA, Surgeon and Anesthesiologist  Anesthesia Plan Comments:         Anesthesia Quick Evaluation

## 2021-07-13 NOTE — Anesthesia Postprocedure Evaluation (Signed)
Anesthesia Post Note  Patient: Paul TRUDEL Sr.  Procedure(s) Performed: URETEROSCOPY WITH HOLMIUM LASER LITHOTRIPSY (Right) CYSTOSCOPY WITH STENT PLACEMENT (Right)  Patient location during evaluation: PACU Anesthesia Type: General Level of consciousness: awake and alert, awake and oriented Pain management: pain level controlled Vital Signs Assessment: post-procedure vital signs reviewed and stable Respiratory status: spontaneous breathing, nonlabored ventilation and respiratory function stable Cardiovascular status: blood pressure returned to baseline and stable Postop Assessment: no apparent nausea or vomiting Anesthetic complications: no   No notable events documented.   Last Vitals:  Vitals:   07/13/21 1400 07/13/21 1405  BP: 112/69   Pulse: 81 68  Resp: 15 18  Temp:    SpO2: 92% 93%    Last Pain:  Vitals:   07/13/21 1405  TempSrc:   PainSc: 1                  Phill Mutter

## 2021-07-13 NOTE — Discharge Instructions (Addendum)
Urethrotomy, Care After This sheet gives you information about how to care for yourself after your procedure. Your health care provider may also give you more specific instructions. If you have problems or questions, contact your health care provider. What can I expect after the procedure? After the procedure, it is common to have: Burning pain when urinating. Pain or discomfort in your genital area. A small amount of blood in your urine. Your health care provider will tell you how long you can expect to have blood in your urine. Bloody urine leaking from around your catheter. Follow these instructions at home: Catheter and drainage bag Follow instructions from your health care provider about how to care for your catheter and your drainage bag. Do not take baths, swim, or use a hot tub until your catheter has been removed. You may take showers while your catheter is in place. If you have to insert a catheter on your own (self-catheterization) after your catheter is removed, make sure you understand the procedure completely. Carefully follow instructions from your health care provider. Medicines Take over-the-counter and prescription medicines only as told by your health care provider. If you were prescribed an antibiotic medicine, take it as told by your health care provider. Do not stop taking the antibiotic even if you start to feel better. Ask your health care provider if the medicine prescribed to you: Requires you to avoid driving or using heavy machinery. Can cause constipation. You may need to take these actions to prevent or treat constipation: Take over-the-counter or prescription medicines. Eat foods that are high in fiber, such as beans, whole grains, and fresh fruits and vegetables. Limit foods that are high in fat and processed sugars, such as fried or sweet foods. Activity Do not drive for 24 hours if you were given a sedative during your procedure. Take short walks several  times a day during your recovery. Do not lift anything that is heavier than 10 lb (4.5 kg), or the limit that you are told, until your health care provider says that it is safe. Return to your normal activities as told by your health care provider. Ask your health care provider what activities are safe for you. Do not have sex until your health care provider says it is okay. General instructions Drink enough fluid to keep your urine pale yellow. If a bandage (dressing) was applied over the opening of your urethra, change the dressing as told by your health care provider. Make sure you: Wash your hands with soap and water before and after you change your dressing. If soap and water are not available, use hand sanitizer. Keep your dressing clean and dry. Do not use any products that contain nicotine or tobacco, such as cigarettes, e-cigarettes, and chewing tobacco. These can delay healing after surgery. If you need help quitting, ask your health care provider. Keep all follow-up visits as told by your health care provider. This is important. Contact a health care provider if you: Have a fever or chills. Have pain that gets worse or does not get better with medicine. Have blood in your urine for longer than your health care provider told you to expect. Are a male and have any of these problems: Trouble getting an erection. Pain when you have an erection. Blood in your semen. Have any of these problems after your catheter is removed: Trouble urinating. A slow urine stream. Urinating less than usual. Several streams or "spray" when you urinate. Have pain in your abdomen. Have swelling  in your genital area that does not go away. Get help right away if: You have severe pain. A lot of blood is leaking from around your catheter. You have blood clots in your urine. Your catheter stops draining urine. You cannot urinate after your catheter is removed. You have redness, warmth, or pain in your  leg. You have chest pain. You have trouble breathing. These symptoms may represent a serious problem that is an emergency. Do not wait to see if the symptoms will go away. Get medical help right away. Call your local emergency services (911 in the [place] Do not drive yourself to the hospital. Summary After the procedure, it is common to have burning pain when urinating and a small amount of blood in your urine. Follow instructions from your health care provider about how to care for your catheter and your drainage bag. Take short walks several times a day during your recovery. If a bandage (dressing) was applied over the opening of your urethra, change the dressing as told by your health care provider. Contact your health care provider if you have trouble urinating after your catheter is removed. This information is not intended to replace advice given to you by your health care provider. Make sure you discuss any questions you have with your health care provider. Document Revised: 01/12/2019 Document Reviewed: 01/12/2019 Elsevier Patient Education  2022 [place] for Kidney Stones, Care After This sheet gives you information about how to care for yourself after your procedure. Your health care provider may also give you more specific instructions. If you have problems or questions, contact your health care provider. What can I expect after the procedure? After the procedure, it is common to have: Pain. A burning sensation while urinating. Small amounts of blood in your urine. A need to urinate frequently. Pieces of kidney stone in your urine. Mild discomfort when urinating that may be felt in the back. You may experience this if you have a flexible tube (stent) in your ureter. Follow these instructions at home: A comparison of three sample cups showing dark yellow, yellow, and pale yellow urine.  Medicines Take over-the-counter and prescription medicines only as  told by your health care provider. If you were prescribed an antibiotic medicine, take it as told by your health care provider. Do not stop taking the antibiotic even if you start to feel better. Ask your health care provider if the medicine prescribed to you: Requires you to avoid driving or using heavy machinery. Can cause constipation. You may need to take actions to prevent or treat constipation, such as: Take over-the-counter or prescription medicines. Eat foods that are high in fiber, such as beans, whole grains, and fresh fruits and vegetables. Limit foods that are high in fat and processed sugars, such as fried or sweet foods. Activity Return to your normal activities as told by your health care provider. Ask your health care provider what activities are safe for you. Do not drive for 24 hours if you were given a sedative during your procedure. General instructions If your health care provider approves, you may take a warm bath to ease discomfort and burning. Drink enough fluid to keep your urine pale yellow. Your health care provider may recommend drinking two 8 oz (237 mL) glasses of water per hour for a few hours after your procedure. You may be asked to strain your urine to collect any stone fragments that you pass. These fragments may be tested.  Keep all follow-up visits as told by your health care provider. This is important. If you have a stent, you will need to return to your health care provider to have the stent removed. Contact a health care provider if you: Have pain or a burning feeling that lasts more than 2 days. Feel nauseous. Vomit more and more often. Have difficulty urinating. Have pain that gets worse or does not get better with medicine. Get help right away if: You are unable to urinate, even if your bladder feels full. You have: Bright red blood or blood clots in your urine. More blood in your urine. Severe pain or discomfort. A fever or shaking  chills. Abdominal pain. Difficulty breathing. Swelling in your legs. Summary After the procedure, it is common to have a burning sensation while urinating and small amounts of blood in your urine. Take over-the-counter and prescription medicines only as told by your health care provider. Drink enough fluid to keep your urine pale yellow. Keep all follow-up visits as told by your health care provider. This is important. This information is not intended to replace advice given to you by your health care provider. Make sure you discuss any questions you have with your health care provider. Document Revised: 03/13/2021 Document Reviewed: 03/13/2021 Elsevier Patient Education  2022 [place]   The drugs that you were given will stay in your system until tomorrow so for the next 24 hours you should not:  Drive an automobile Make any legal decisions Drink any alcoholic beverage   You may resume regular meals tomorrow.  Today it is better to start with liquids and gradually work up to solid foods.  You may eat anything you prefer, but it is better to start with liquids, then soup and crackers, and gradually work up to solid foods.   Please notify your doctor immediately if you have any unusual bleeding, trouble breathing, redness and pain at the surgery site, drainage, fever, or pain not relieved by medication.    Additional Instructions:        Please contact your physician with any problems or Same Day Surgery at [phone], Monday through Friday 6 am to 4 pm, or [facility] number at [phone].

## 2021-07-13 NOTE — Op Note (Signed)
Preoperative diagnosis: 1.  Right ureterolithiasis (N20.1)                                           2.  Right hydronephrosis (N13.2)                                  Postoperative diagnosis: 1.  Right ureterolithiasis (N20.1)                                             2.  Right hydronephrosis N13.2)                                             3.  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  Right ureteroscopic ureterolithotomy with holmium laser lithotripsy (CPT 52353)                     2.  Visual internal ureterotomy (CPT 52276)                     3.  Retrograde pyelogram (CPT 52005)                     4.  Foley catheter placement (CPT [phone])  Surgeon: Otelia Limes. Yves Dill MD  Anesthesia: General  Indications:See the history and physical. After informed consent the above procedure(s) were requested     Technique and findings: After adequate general anesthesia been obtained the patient was placed into dorsal lithotomy position and the perineum was prepped and draped in the usual fashion.  The 21 French cystoscope was coupled with a camera and then advanced into the urethra.  A stricture was encountered in the proximal pendulous urethra measuring 2 mm in aperture.  A 0.035 Glidewire was passed through the stricture and curled into the bladder.  The cystoscope was then exchanged for the visual urethrotome.  The urethrotome was advanced to the level of the stricture and this was incised at the 12 o'clock position allowing passage of the scope beyond the stricture.  A second stricture was then encountered in the bulbar urethra and the stricture was also incised at the 12 o'clock position allowing passage of the scope into the bladder.  Bladder was thoroughly inspected.  No bladder tumors were identified.  The urethrotome was then exchanged for the cystoscope and a 6 [place] open-ended ureteral catheter was advanced into the right ureteral orifice and retrograde pyelography was performed.  The  patient had a very dilated distal ureter with a stone near the UVJ.  The 0.035 Glidewire was then passed up the ureter and curled into the renal pelvis.  The intramural ureter was dilated to 6 mm with the balloon dilating ureteral catheter.  The balloon catheter was then removed taking care to leave the guidewire in position.  The rigid short mini ureteroscope was then advanced into the distal ureter and stone identified.  The 37 m holmium fiber was then selected and set at the dust setting.  The stone was fully pulverized.  The ureteroscope was then removed and the  ureter was widely patent so stent was not placed.  10 cc of viscous Xylocaine was instilled within the urethra.  An 53 French silicone catheter was placed and irrigated until clear.  A B&O suppository was placed.  Blood loss was minimal.  Procedure was then terminated and patient transferred to the recovery room in stable condition.

## 2021-07-13 NOTE — H&P (Signed)
Date of Initial H&P: 07/10/21  History reviewed, patient examined, no change in status, stable for surgery.

## 2021-07-13 NOTE — Anesthesia Procedure Notes (Signed)
Procedure Name: Intubation Date/Time: 07/13/2021 11:46 AM Performed by: Lia Foyer, CRNA Pre-anesthesia Checklist: Patient identified, Emergency Drugs available, Suction available and Patient being monitored Patient Re-evaluated:Patient Re-evaluated prior to induction Oxygen Delivery Method: Circle system utilized Preoxygenation: Pre-oxygenation with 100% oxygen Induction Type: IV induction Ventilation: Mask ventilation without difficulty Laryngoscope Size: McGraph and 3 Grade View: Grade I Tube type: Oral Tube size: 7.5 mm Number of attempts: 1 Airway Equipment and Method: Stylet and Video-laryngoscopy Placement Confirmation: ETT inserted through vocal cords under direct vision, positive ETCO2 and breath sounds checked- equal and bilateral Secured at: 23 cm Tube secured with: Tape Dental Injury: Teeth and Oropharynx as per pre-operative assessment

## 2021-07-13 NOTE — Transfer of Care (Signed)
Immediate Anesthesia Transfer of Care Note  Patient: Paul Kanaris Sr.  Procedure(s) Performed: URETEROSCOPY WITH HOLMIUM LASER LITHOTRIPSY (Right) CYSTOSCOPY WITH STENT PLACEMENT (Right)  Patient Location: PACU  Anesthesia Type:General  Level of Consciousness: drowsy  Airway & Oxygen Therapy: Patient Spontanous Breathing and Patient connected to face mask oxygen  Post-op Assessment: Report given to RN and Post -op Vital signs reviewed and stable  Post vital signs: Reviewed and stable  Last Vitals:  Vitals Value Taken Time  BP 118/75 07/13/21 1300  Temp    Pulse 82 07/13/21 1302  Resp 25 07/13/21 1302  SpO2 95 % 07/13/21 1302  Vitals shown include unvalidated device data.  Last Pain:  Vitals:   07/13/21 1039  TempSrc: Oral  PainSc: 0-No pain         Complications: No notable events documented.

## 2021-07-14 ENCOUNTER — Encounter: Payer: Self-pay | Admitting: Urology

## 2021-10-05 ENCOUNTER — Ambulatory Visit
Admission: RE | Admit: 2021-10-05 | Discharge: 2021-10-05 | Disposition: A | Discharge status: Home / Self Care | Payer: Medicare Other | Attending: Urology | Admitting: Urology

## 2021-10-05 ENCOUNTER — Encounter
Admission: RE | Disposition: A | Discharge status: Home / Self Care | Payer: Self-pay | Source: Home / Self Care | Attending: Urology

## 2021-10-05 ENCOUNTER — Other Ambulatory Visit: Payer: Self-pay

## 2021-10-05 ENCOUNTER — Encounter: Payer: Self-pay | Admitting: Urology

## 2021-10-05 DIAGNOSIS — E119 Type 2 diabetes mellitus without complications: Secondary | ICD-10-CM | POA: Insufficient documentation

## 2021-10-05 DIAGNOSIS — N200 Calculus of kidney: Secondary | ICD-10-CM

## 2021-10-05 DIAGNOSIS — Z7902 Long term (current) use of antithrombotics/antiplatelets: Secondary | ICD-10-CM | POA: Diagnosis not present

## 2021-10-05 DIAGNOSIS — Z7982 Long term (current) use of aspirin: Secondary | ICD-10-CM | POA: Diagnosis not present

## 2021-10-05 DIAGNOSIS — Z8673 Personal history of transient ischemic attack (TIA), and cerebral infarction without residual deficits: Secondary | ICD-10-CM | POA: Insufficient documentation

## 2021-10-05 DIAGNOSIS — I10 Essential (primary) hypertension: Secondary | ICD-10-CM | POA: Diagnosis not present

## 2021-10-05 DIAGNOSIS — E669 Obesity, unspecified: Secondary | ICD-10-CM | POA: Insufficient documentation

## 2021-10-05 DIAGNOSIS — Z6841 Body Mass Index (BMI) 40.0 and over, adult: Secondary | ICD-10-CM | POA: Diagnosis not present

## 2021-10-05 LAB — GLUCOSE, CAPILLARY: Glucose-Capillary: 107 mg/dL — ABNORMAL HIGH (ref 70–99)

## 2021-10-05 SURGERY — LITHOTRIPSY, ESWL
Anesthesia: Moderate Sedation | Laterality: Left

## 2021-10-05 MED ORDER — DIPHENHYDRAMINE HCL 25 MG PO CAPS
ORAL_CAPSULE | ORAL | Status: AC
Start: 2021-10-05 — End: 2021-10-05
  Administered 2021-10-05: 25 mg via ORAL
  Filled 2021-10-05: qty 1

## 2021-10-05 MED ORDER — FUROSEMIDE 10 MG/ML IJ SOLN
10.0000 mg | Freq: Once | INTRAMUSCULAR | Status: AC
Start: 2021-10-05 — End: 2021-10-05
  Administered 2021-10-05: 10 mg via INTRAVENOUS

## 2021-10-05 MED ORDER — MIDAZOLAM HCL 2 MG/2ML IJ SOLN
INTRAMUSCULAR | Status: AC
  Administered 2021-10-05: 1 mg via INTRAMUSCULAR
  Filled 2021-10-05: qty 2

## 2021-10-05 MED ORDER — CIPROFLOXACIN HCL 500 MG PO TABS: 500.0000 mg | ORAL_TABLET | Freq: Two times a day (BID) | ORAL | 0 refills | Status: DC

## 2021-10-05 MED ORDER — TAMSULOSIN HCL 0.4 MG PO CAPS: 0.4000 mg | ORAL_CAPSULE | Freq: Every day | ORAL | 11 refills | Status: DC

## 2021-10-05 MED ORDER — LEVOFLOXACIN 500 MG PO TABS
ORAL_TABLET | ORAL | Status: AC
  Administered 2021-10-05: 500 mg via ORAL
  Filled 2021-10-05: qty 1

## 2021-10-05 MED ORDER — PROMETHAZINE HCL 25 MG/ML IJ SOLN: 25.0000 mg | Freq: Once | INTRAMUSCULAR | Status: AC

## 2021-10-05 MED ORDER — PROMETHAZINE HCL 25 MG/ML IJ SOLN
INTRAMUSCULAR | Status: AC
  Administered 2021-10-05: 25 mg via INTRAMUSCULAR
  Filled 2021-10-05: qty 1

## 2021-10-05 MED ORDER — MORPHINE SULFATE (PF) 10 MG/ML IV SOLN: 10.0000 mg | Freq: Once | INTRAVENOUS | Status: AC

## 2021-10-05 MED ORDER — DIPHENHYDRAMINE HCL 25 MG PO CAPS: 25.0000 mg | ORAL_CAPSULE | Freq: Once | ORAL | Status: AC

## 2021-10-05 MED ORDER — MIDAZOLAM HCL 2 MG/2ML IJ SOLN: 1.0000 mg | Freq: Once | INTRAMUSCULAR | Status: AC

## 2021-10-05 MED ORDER — DEXTROSE-NACL 5-0.45 % IV SOLN: INTRAVENOUS | Status: DC

## 2021-10-05 MED ORDER — FUROSEMIDE 10 MG/ML IJ SOLN
INTRAMUSCULAR | Status: AC
  Filled 2021-10-05: qty 2

## 2021-10-05 MED ORDER — MORPHINE SULFATE (PF) 10 MG/ML IV SOLN
INTRAVENOUS | Status: AC
  Administered 2021-10-05: 10 mg via INTRAMUSCULAR
  Filled 2021-10-05: qty 1

## 2021-10-05 MED ORDER — LEVOFLOXACIN 500 MG PO TABS: 500.0000 mg | ORAL_TABLET | Freq: Every day | ORAL | Status: DC

## 2021-10-05 NOTE — Discharge Instructions (Signed)
Lithotripsy, Care After ?This sheet gives you information about how to care for yourself after your procedure. Your health care provider may also give you more specific instructions. If you have problems or questions, contact your health care provider. ?What can I expect after the procedure? ?After the procedure, it is common to have: ?Some blood in your urine. This should only last for a few days. ?Soreness in your back, sides, or upper abdomen for a few days. ?Blotches or bruises on the area where the shock wave entered the skin. ?Pain, discomfort, or nausea when pieces (fragments) of the kidney stone move through the tube that carries urine from the kidney to the bladder (ureter). Stone fragments may pass soon after the procedure, but they may continue to pass for up to 4-8 weeks. ?If you have severe pain or nausea, contact your health care provider. This may be caused by a large stone that was not broken up, and this may mean that you need more treatment. ?Some pain or discomfort during urination. ?Some pain or discomfort in the lower abdomen or (in men) at the base of the penis. ?Follow these instructions at home: ?Medicines ?Take over-the-counter and prescription medicines only as told by your health care provider. ?If you were prescribed an antibiotic medicine, take it as told by your health care provider. Do not stop taking the antibiotic even if you start to feel better. ?Ask your health care provider if the medicine prescribed to you requires you to avoid driving or using machinery. ?Eating and drinking ?A comparison of three sample cups showing dark yellow, yellow, and pale yellow urine. ? ?  ?A plate with examples of foods in a healthy diet. ? ?Drink enough fluid to keep your urine pale yellow. This helps any remaining pieces of the stone to pass. It can also help prevent new stones from forming. ?Eat plenty of fresh fruits and vegetables. ?Follow instructions from your health care provider about eating  or drinking restrictions. You may be instructed to: ?Reduce how much salt (sodium) you eat or drink. Check ingredients and nutrition facts on packaged foods and beverages to see how much sodium they contain. ?Reduce how much meat you eat. ?Eat the recommended amount of calcium for your age and gender. Ask your health care provider how much calcium you should have. ?General instructions ?Get plenty of rest. ?Return to your normal activities as told by your health care provider. Ask your health care provider what activities are safe for you. Most people can resume normal activities 1-2 days after the procedure. ?If you were given a sedative during the procedure, it can affect you for several hours. Do not drive or operate machinery until your health care provider says that it is safe. ?Your health care provider may direct you to lie in a certain position (postural drainage) and tap firmly (percuss) over your kidney area to help stone fragments pass. Follow instructions as told by your health care provider. ?If directed, strain all urine through the strainer that was provided by your health care provider. ?Keep all fragments for your health care provider to see. Any stones that are found may be sent to a medical lab for examination. The stone may be as small as a grain of salt. ?Keep all follow-up visits as told by your health care provider. This is important. ?Contact a health care provider if: ?You have a fever or chills. ?You have nausea that is severe or does not go away. ?You have any of these  urinary symptoms: ?Blood in your urine for longer than your health care provider told you to expect. ?Urine that smells bad or unusual. ?Feeling a strong urge to urinate after emptying your bladder. ?Pain or burning with urination that does not go away. ?Urinating more often than usual and this does not go away. ?You have a stent and it comes out. ?Get help right away if: ?You have severe pain in your back, sides, or upper  abdomen. ?You have any of these urinary symptoms: ?Severe pain while urinating. ?More blood in your urine or having blood in your urine when you did not before. ?Passing blood clots in your urine. ?Passing only a small amount of urine or being unable to pass any urine at all. ?You have severe nausea that leads to persistent vomiting. ?You faint. ?Summary ?After this procedure, it is common to have some pain, discomfort, or nausea when pieces (fragments) of the kidney stone move through the tube that carries urine from the kidney to the bladder (ureter). If this pain or nausea is severe, however, you should contact your health care provider. ?Return to your normal activities as told by your health care provider. Ask your health care provider what activities are safe for you. ?Drink enough fluid to keep your urine pale yellow. This helps any remaining pieces of the stone to pass, and it can help prevent new stones from forming. ?If directed, strain your urine and keep all fragments for your health care provider to see. Fragments or stones may be as small as a grain of salt. ?Get help right away if you have severe pain in your back, sides, or upper abdomen, or if you have severe pain while urinating. ?This information is not intended to replace advice given to you by your health care provider. Make sure you discuss any questions you have with your health care provider. ?Document Revised: 03/13/2021 Document Reviewed: 03/13/2021 ?Elsevier Patient Education ? 2022 [place]. ?   ? ?

## 2021-10-10 ENCOUNTER — Encounter: Payer: Self-pay | Admitting: Urology

## 2021-12-09 IMAGING — US US CAROTID DUPLEX BILAT
1 series · 13 of 24 positions shown · non-contrast
Comparison: None.

CLINICAL DATA: Stroke.  Hypertension, hyperlipidemia, diabetes.

EXAM:
BILATERAL CAROTID DUPLEX ULTRASOUND
TECHNIQUE: Gray scale imaging, color Doppler and duplex ultrasound were
performed of bilateral carotid and vertebral arteries in the neck.

[Series 1: us carotid duplex bilat · 13 of 62 slices shown]
[im 1/62]
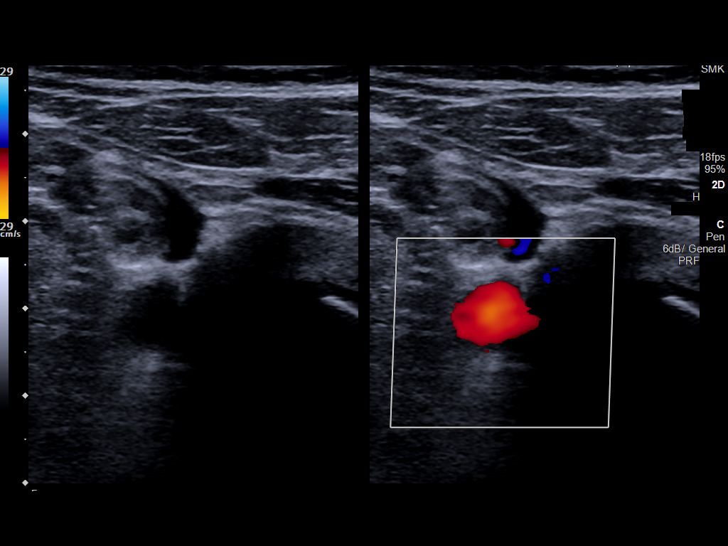
[im 6/62]
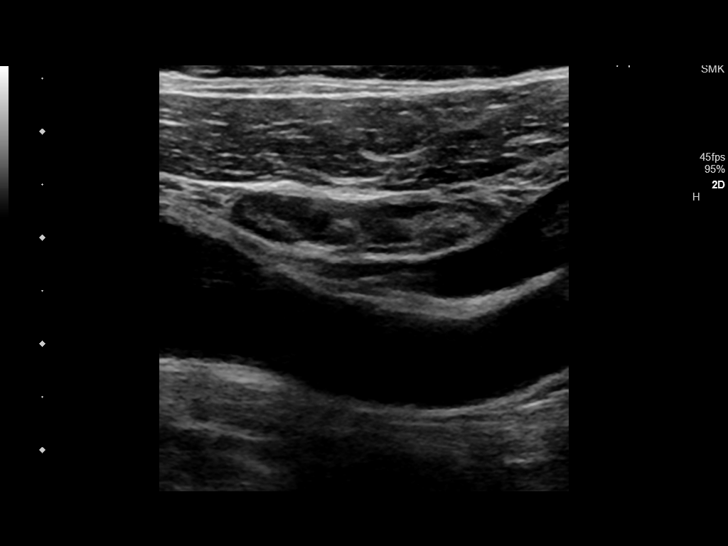
[im 11/62]
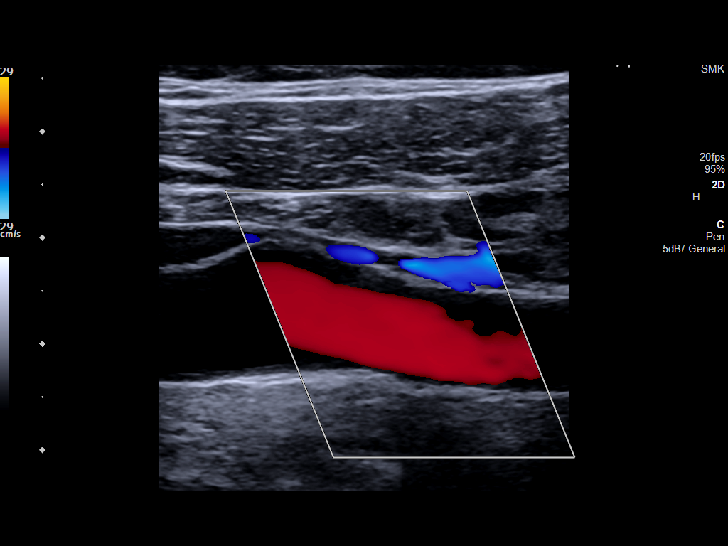
[im 16/62]
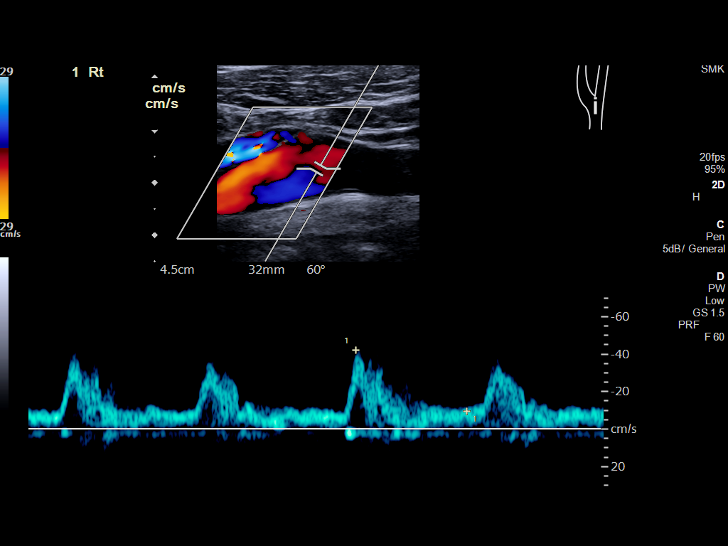
[im 22/62]
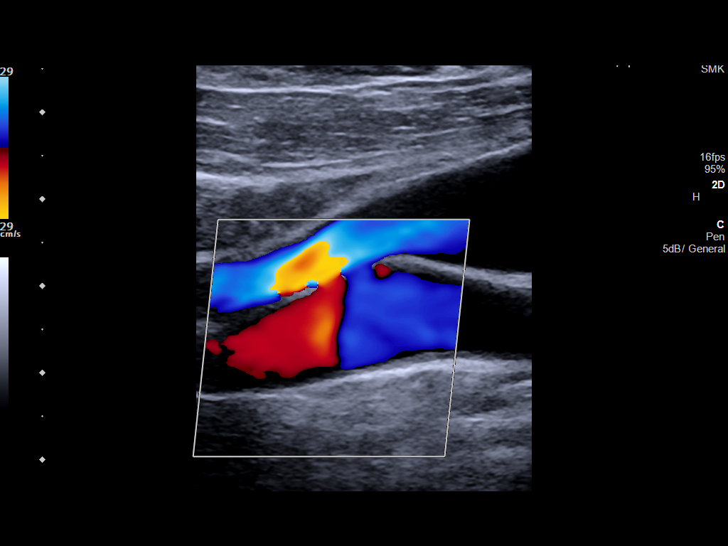
[im 27/62]
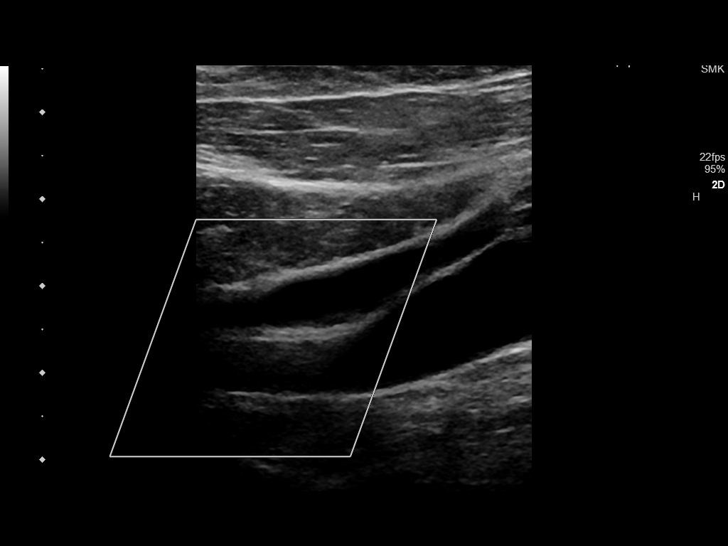
[im 32/62]
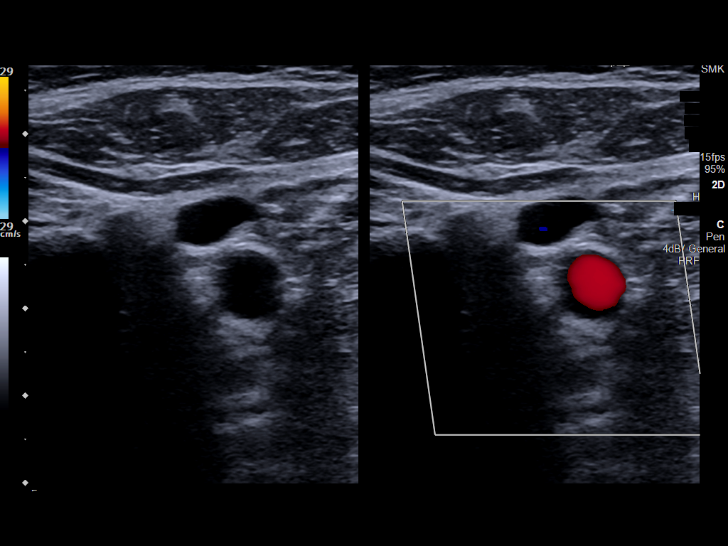
[im 35/62]
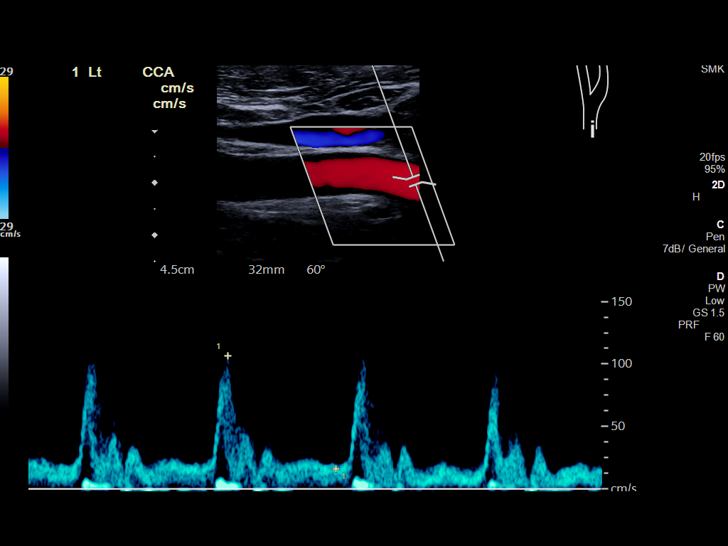
[im 40/62]
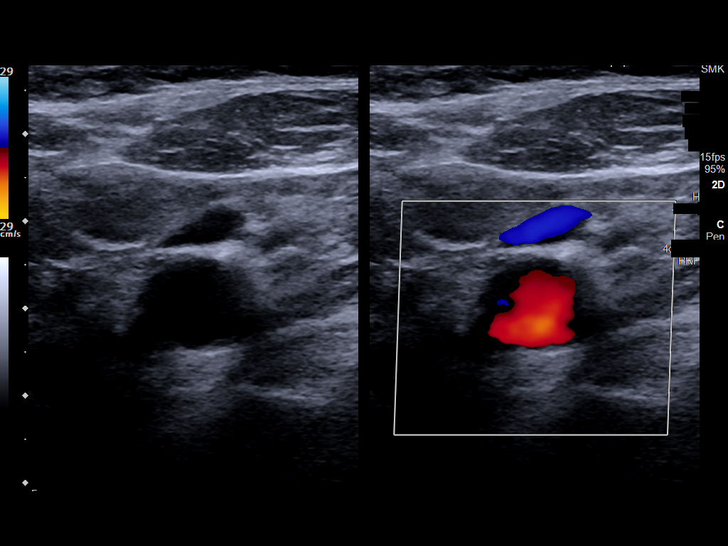
[im 46/62]
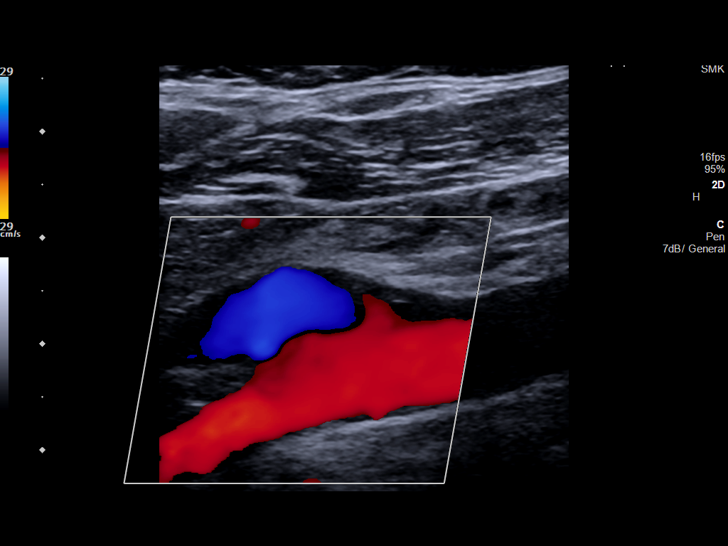
[im 51/62]
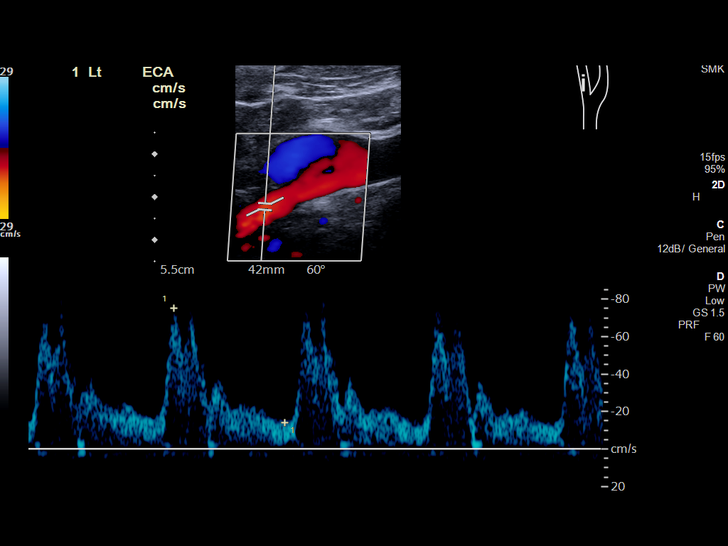
[im 56/62]
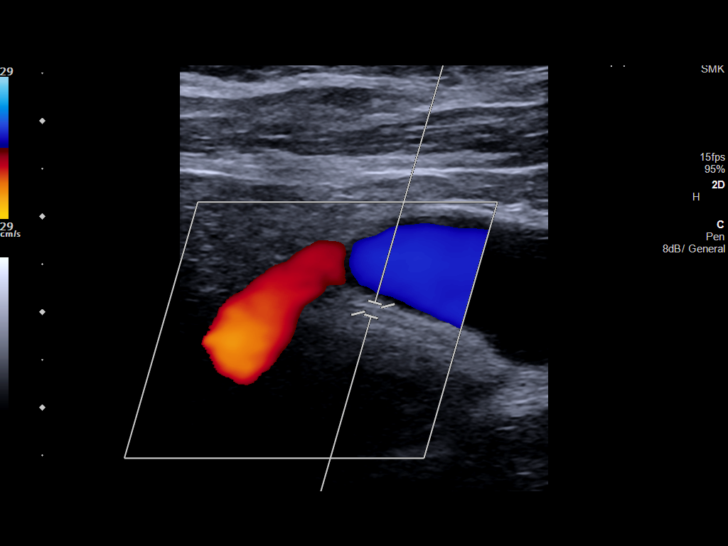
[im 62/62]
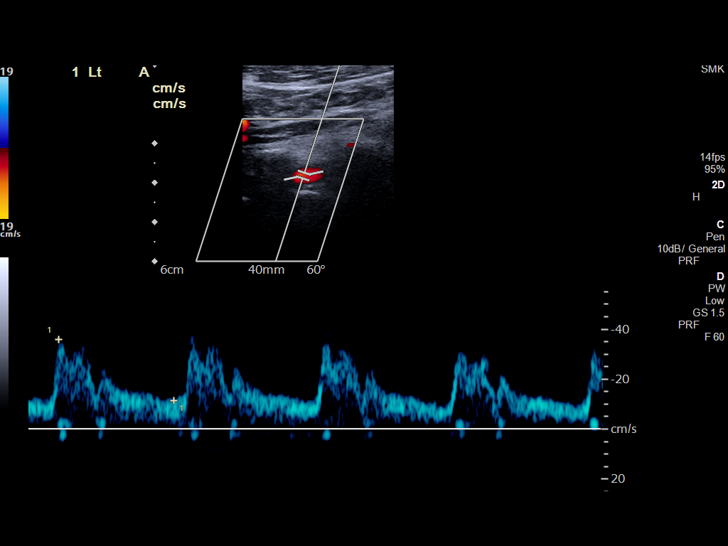

[13 of 24 positions shown; findings below may reference images not displayed]

FINDINGS: Criteria: Quantification of carotid stenosis is based on velocity
parameters that correlate the residual internal carotid diameter
with NASCET-based stenosis levels, using the diameter of the distal
internal carotid lumen as the denominator for stenosis measurement.

The following velocity measurements were obtained:

RIGHT

ICA: 46/7 cm/sec

CCA: 72/8 cm/sec

SYSTOLIC ICA/CCA RATIO:

ECA: 98 cm/sec

LEFT

ICA: 54/13 cm/sec

CCA: 88/15 cm/sec

SYSTOLIC ICA/CCA RATIO:

ECA: 75 cm/sec

RIGHT CAROTID ARTERY: Mild tortuosity. No significant plaque
accumulation or stenosis. Normal waveforms and color Doppler signal.

RIGHT VERTEBRAL ARTERY:  Normal flow direction and waveform.

LEFT CAROTID ARTERY: Minimal eccentric plaque in the bifurcation. No
high-grade stenosis. Normal waveforms and color Doppler signal. Mild
tortuosity.

LEFT VERTEBRAL ARTERY:  Normal flow direction and waveform.
IMPRESSION: 1. Mild left carotid bifurcation plaque resulting in less than 50%
diameter stenosis.
2. No significant right carotid plaque.
3.  Antegrade bilateral vertebral arterial flow.

## 2022-03-13 ENCOUNTER — Other Ambulatory Visit: Payer: Self-pay | Admitting: Internal Medicine

## 2022-03-13 DIAGNOSIS — N1831 Chronic kidney disease, stage 3a: Secondary | ICD-10-CM

## 2022-07-26 ENCOUNTER — Encounter: Payer: Self-pay | Admitting: Urology

## 2022-07-26 ENCOUNTER — Other Ambulatory Visit: Payer: Self-pay

## 2022-07-26 ENCOUNTER — Ambulatory Visit (INDEPENDENT_AMBULATORY_CARE_PROVIDER_SITE_OTHER): Payer: Medicare Other | Admitting: Urology

## 2022-07-26 VITALS — BP 126/90 | HR 108 | Ht 70.0 in | Wt 273.0 lb

## 2022-07-26 DIAGNOSIS — Z8546 Personal history of malignant neoplasm of prostate: Secondary | ICD-10-CM | POA: Diagnosis not present

## 2022-07-26 DIAGNOSIS — Z87442 Personal history of urinary calculi: Secondary | ICD-10-CM | POA: Diagnosis not present

## 2022-07-26 DIAGNOSIS — R399 Unspecified symptoms and signs involving the genitourinary system: Secondary | ICD-10-CM

## 2022-07-26 DIAGNOSIS — N200 Calculus of kidney: Secondary | ICD-10-CM

## 2022-07-26 MED ORDER — DUTASTERIDE 0.5 MG PO CAPS: 0.5000 mg | ORAL_CAPSULE | Freq: Every day | ORAL | 3 refills | Status: DC

## 2022-07-26 MED ORDER — TAMSULOSIN HCL 0.4 MG PO CAPS: 0.4000 mg | ORAL_CAPSULE | Freq: Every day | ORAL | 11 refills | Status: DC

## 2022-07-26 NOTE — Progress Notes (Signed)
07/26/22 1:23 PM   Paul Kanaris Sr. 1940-11-10 [id number]  CC: Low risk prostate cancer, nocturia, BPH  HPI: 82 year old male transferring his urologic care from Dr. Yves Dill.  I reviewed the outside notes extensively.  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  He has been on 50 mg daily Casodex, Flomax, and dutasteride since that time.  PSA has remained very low, most recently 0.2 in January 2023.  He also has a history of kidney stones and has previously undergone shockwave lithotripsy as well as ureteroscopy.  From reviewing the operative note from December 2022 he also was found to have a ureteral stricture and underwent DVIU at time of ureteroscopy.  He denies any urinary symptoms since that time and really denies any significant urinary complaints aside from nocturia 2-3 times at night.  He denies any UTIs, gross hematuria, or dysuria.  He has sleep apnea but is noncompliant with CPAP machine.   PMH: Past Medical History:  Diagnosis Date   Arthritis    Cancer ([place])    Diabetes mellitus without complication ([place])    GERD (gastroesophageal reflux disease)    History of kidney stones    Hyperlipidemia    Hypertension    PONV (postoperative nausea and vomiting)    Stroke [facility]     Surgical History: Past Surgical History:  Procedure Laterality Date   COLONOSCOPY     CYSTOSCOPY     CYSTOSCOPY WITH STENT PLACEMENT Right 07/13/2021   Procedure: CYSTOSCOPY WITH STENT PLACEMENT;  Surgeon: Royston Cowper, MD;  Location: [facility] ORS;  Service: Urology;  Laterality: Right;   ESOPHAGOGASTRODUODENOSCOPY     EXTRACORPOREAL SHOCK WAVE LITHOTRIPSY Left 10/05/2021   Procedure: EXTRACORPOREAL SHOCK WAVE LITHOTRIPSY (ESWL);  Surgeon: Royston Cowper, MD;  Location: [facility] ORS;  Service: Urology;  Laterality: Left;   FRACTURE SURGERY     HERNIA REPAIR     TONSILLECTOMY     URETEROSCOPY WITH  HOLMIUM LASER LITHOTRIPSY Right 07/13/2021   Procedure: URETEROSCOPY WITH HOLMIUM LASER LITHOTRIPSY;  Surgeon: Royston Cowper, MD;  Location: [facility] ORS;  Service: Urology;  Laterality: Right;    Social History:  reports that he has never smoked. He has never used smokeless tobacco. He reports that he does not currently use alcohol. He reports that he does not currently use drugs.  Physical Exam: BP (!) 126/90 (BP Location: Left Arm, Patient Position: Sitting, Cuff Size: Large)   Pulse (!) 108   Ht '5\' 10"'$  (1.778 m)   Wt 273 lb (123.8 kg)   BMI 39.17 kg/m    Constitutional:  Alert and oriented, No acute distress. Cardiovascular: No clubbing, cyanosis, or edema. Respiratory: Normal respiratory effort, no increased work of breathing. GI: Abdomen is soft, nontender, nondistended, no abdominal masses  Laboratory Data: Reviewed in outside records, see HPI  Pertinent Imaging: I have personally viewed and interpreted the CT from December 2022 showing no evidence of metastatic disease, 5 mm right distal ureteral stone.  Assessment & Plan:   82 year old male with a number of comorbidities as well as a very small volume of low risk prostate cancer diagnosed by Dr. Eliberto Ivory in 2011, and he has been on Casodex since that time.  We reviewed the AUA guidelines that recommend active surveillance for patients with low risk disease, and I think he can safely come off the Casodex.  Will refill the Flomax and dutasteride for  his baseline BPH symptoms, and I encouraged him to resume CPAP to see if this improves his nocturia.  Behavioral strategies regarding nocturia were also discussed.  Flomax and dutasteride refilled Can discontinue Casodex PSA today, can continue yearly monitoring for low risk disease RTC 1 year PSA prior, PVR, KUB for history of stones   Nickolas Madrid, MD 07/26/2022  [place] [address] [place],  [address] [phone]

## 2022-07-26 NOTE — Patient Instructions (Signed)
Nocturia refers to the need to wake up during the night to urinate, which can disrupt your sleep and impact your overall well-being. Fortunately, there are several strategies you can employ to help prevent or manage nocturia. It's important to consult with your healthcare provider before making any significant changes to your routine. Here are some helpful strategies to consider:  One of the most common causes of nocturia is sleep apnea not treated with CPAP.  By using your CPAP machine, many patients will notice significant decrease in the overnight urination  Limit Fluid Intake Before Bed: Avoid drinking large amounts of fluids in the evening, especially within a few hours of bedtime. Consume most of your daily fluid intake earlier in the day to reduce the need to urinate at night.  Monitor Your Diet: Limit your intake of caffeine and alcohol, as these substances can increase urine production and irritate the bladder.  Avoid diet, "zero calorie," and artificially sweetened drinks, especially sodas, in the afternoon or evening. Be mindful of consuming foods and drinks with high water content before bedtime, such as watermelon and herbal teas.  Time Your Medications: If you're taking medications that contribute to increased urination, consult your healthcare provider about adjusting the timing of these medications to minimize their impact during the night.  Practice Double Voiding: Before going to bed, make an effort to empty your bladder twice within a short period. This can help reduce the amount of urine left in your bladder before sleep.  Bladder Training: Gradually increase the time between bathroom visits during the day to train your bladder to hold larger volumes of urine. Over time, this can help reduce the frequency of nighttime awakenings to urinate.  Elevate Your Legs During the Day: Elevating your legs during the day can help minimize fluid retention in your lower extremities,  which might reduce nighttime urination.  Pelvic Floor Exercises: Strengthening your pelvic floor muscles through Kegel exercises can help improve bladder control and potentially reduce the urge to urinate at night.  Create a Relaxing Bedtime Routine: Stress and anxiety can exacerbate nocturia. Engage in calming activities before bed, such as reading, listening to soothing music, or practicing relaxation techniques.  Stay Active: Engage in regular physical activity, but avoid intense exercise close to bedtime, as this can increase your body's demand for fluids.  Maintain a Healthy Weight: Excess weight can compress the bladder and contribute to bladder and urinary issues. Aim to achieve and maintain a healthy weight through a balanced diet and regular exercise.  Remember that every individual is unique, and the effectiveness of these strategies may vary. It's important to work with your healthcare provider to develop a plan that suits your specific needs and addresses any underlying causes of nocturia.

## 2022-07-26 NOTE — Addendum Note (Signed)
Addended by: Donalee Citrin on: 07/26/2022 01:32 PM   Modules accepted: Orders

## 2022-07-27 ENCOUNTER — Other Ambulatory Visit: Payer: Self-pay

## 2022-07-27 DIAGNOSIS — Z8546 Personal history of malignant neoplasm of prostate: Secondary | ICD-10-CM

## 2022-07-27 LAB — PSA: Prostate Specific Ag, Serum: 0.2 ng/mL (ref 0.0–4.0)

## 2023-07-26 ENCOUNTER — Other Ambulatory Visit: Payer: Medicare Other

## 2023-07-26 DIAGNOSIS — Z8546 Personal history of malignant neoplasm of prostate: Secondary | ICD-10-CM

## 2023-07-27 LAB — PSA: Prostate Specific Ag, Serum: 0.8 ng/mL (ref 0.0–4.0)

## 2023-08-01 ENCOUNTER — Encounter: Payer: Self-pay | Admitting: Urology

## 2023-08-01 ENCOUNTER — Ambulatory Visit (INDEPENDENT_AMBULATORY_CARE_PROVIDER_SITE_OTHER): Payer: Medicare Other | Admitting: Urology

## 2023-08-01 VITALS — BP 169/101 | HR 111 | Ht 71.0 in | Wt 270.0 lb

## 2023-08-01 DIAGNOSIS — C61 Malignant neoplasm of prostate: Secondary | ICD-10-CM

## 2023-08-01 DIAGNOSIS — R399 Unspecified symptoms and signs involving the genitourinary system: Secondary | ICD-10-CM | POA: Diagnosis not present

## 2023-08-01 DIAGNOSIS — Z8546 Personal history of malignant neoplasm of prostate: Secondary | ICD-10-CM

## 2023-08-01 LAB — BLADDER SCAN AMB NON-IMAGING

## 2023-08-01 MED ORDER — TAMSULOSIN HCL 0.4 MG PO CAPS: 0.4000 mg | ORAL_CAPSULE | Freq: Every day | ORAL | 11 refills | Status: DC

## 2023-08-01 MED ORDER — DUTASTERIDE 0.5 MG PO CAPS: 0.5000 mg | ORAL_CAPSULE | Freq: Every day | ORAL | 3 refills | Status: DC

## 2023-08-01 NOTE — Patient Instructions (Signed)
 Nocturia refers to the need to wake up during the night to urinate, which can disrupt your sleep and impact your overall well-being. Fortunately, there are several strategies you can employ to help prevent or manage nocturia. It's important to consult with your healthcare provider before making any significant changes to your routine. Here are some helpful strategies to consider:  Sleep apnea: This is a common cause of overnight urination.  Most patients will see significant improvement when they use their CPAP machine  Limit Fluid Intake Before Bed: Avoid drinking large amounts of fluids in the evening, especially within a few hours of bedtime. Consume most of your daily fluid intake earlier in the day to reduce the need to urinate at night.  Monitor Your Diet: Limit your intake of caffeine and alcohol, as these substances can increase urine production and irritate the bladder.  Avoid diet, zero calorie, and artificially sweetened drinks, especially sodas, in the afternoon or evening. Be mindful of consuming foods and drinks with high water content before bedtime, such as watermelon and herbal teas.  Time Your Medications: If you're taking medications that contribute to increased urination, consult your healthcare provider about adjusting the timing of these medications to minimize their impact during the night.  Practice Double Voiding: Before going to bed, make an effort to empty your bladder twice within a short period. This can help reduce the amount of urine left in your bladder before sleep.  Bladder Training: Gradually increase the time between bathroom visits during the day to train your bladder to hold larger volumes of urine. Over time, this can help reduce the frequency of nighttime awakenings to urinate.  Elevate Your Legs During the Day: Elevating your legs during the day can help minimize fluid retention in your lower extremities, which might reduce nighttime  urination.  Pelvic Floor Exercises: Strengthening your pelvic floor muscles through Kegel exercises can help improve bladder control and potentially reduce the urge to urinate at night.  Create a Relaxing Bedtime Routine: Stress and anxiety can exacerbate nocturia. Engage in calming activities before bed, such as reading, listening to soothing music, or practicing relaxation techniques.  Stay Active: Engage in regular physical activity, but avoid intense exercise close to bedtime, as this can increase your body's demand for fluids.  Maintain a Healthy Weight: Excess weight can compress the bladder and contribute to bladder and urinary issues. Aim to achieve and maintain a healthy weight through a balanced diet and regular exercise.  Remember that every individual is unique, and the effectiveness of these strategies may vary. It's important to work with your healthcare provider to develop a plan that suits your specific needs and addresses any underlying causes of nocturia.

## 2023-08-01 NOTE — Progress Notes (Signed)
   08/01/2023 1:42 PM   Paul JONETTA Quale Sr. 11-14-1940 [id number]  Reason for visit: Follow up low risk prostate cancer, nocturia, BPH  HPI: 83 year old male previously followed by Dr. Gala he transfer his care to [facility] urology in January 2024.  He underwent a prostate biopsy with Dr. 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  He had initially been on Casodex, Flomax , dutasteride .  PSA has remained very low, most recently 0.8(corrected for dutasteride  1.6) from January 2025 which is stable from prior values.  We stopped his Casodex at our initial visit in January 2024.  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  He has baseline nocturia 2-3 times at night.  He has sleep apnea but noncompliant with CPAP machine.  He denies any significant changes since her last visit.  He stopped the Casodex last year and has had no significant changes in the PSA since that time.  Most recent PSA 0.8, corrected for dutasteride  1.6, and stable from the last few years.  PVR today normal at 0ml.  Primary issue is nocturia 2-4 times at night, he remains noncompliant with CPAP machine.  I recommended trialing the CPAP, as well as behavioral strategies of minimizing fluids prior to bedtime, elevation of the legs, and double voiding prior to bed.  -Continue active surveillance for low risk prostate cancer that has been stable for >10 years -Flomax  and dutasteride  refilled -RTC 1 year PSA prior, PVR  Paul JAYSON Burnet, MD  [facility] [address], [address] [place], [place] [address] [phone]

## 2023-11-06 IMAGING — CT CT ABD-PEL WO/W CM
2 of 10 series · 12 of 46 positions shown, 18 images · IV contrast (APPLIED)
Comparison: None.

CLINICAL DATA: Right flank pain for 1 week.  Hematuria.

EXAM:
CT ABDOMEN AND PELVIS WITHOUT AND WITH CONTRAST
TECHNIQUE: Multidetector CT imaging of the abdomen and pelvis was performed
following the standard protocol before and following the bolus
administration of intravenous contrast.
CONTRAST:  100mL OMNIPAQUE IOHEXOL 300 MG/ML  SOLN

[Series 2: axial pre · axial · non-contrast · 0.96mm/px · z∈[-554,-134]mm · 9 of 106 slices shown, 15 images]
[im 11/106  soft-tissue]
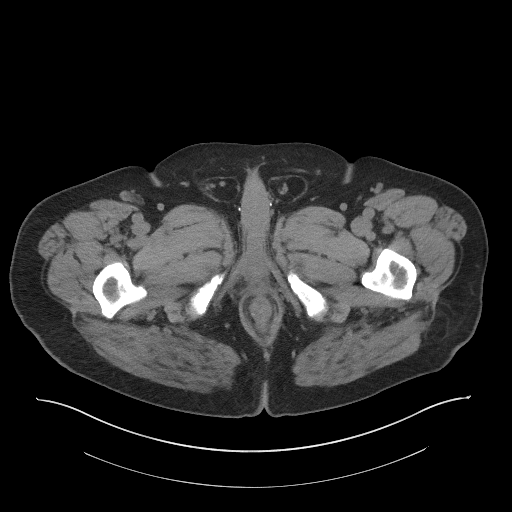
[im 11/106  bone]
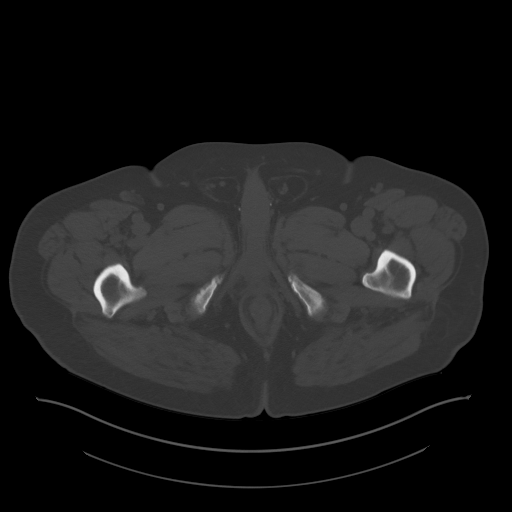
[im 22/106  soft-tissue]
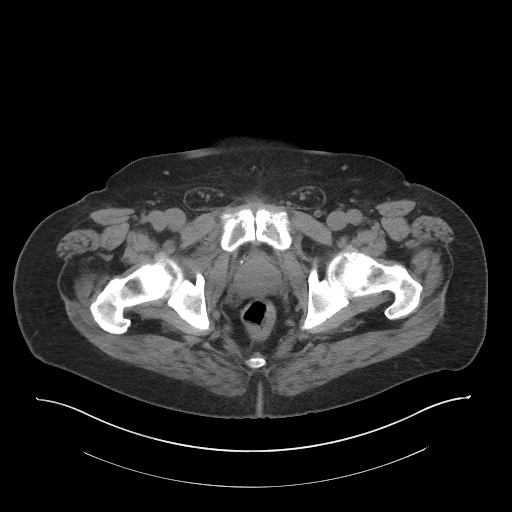
[im 32/106  soft-tissue]
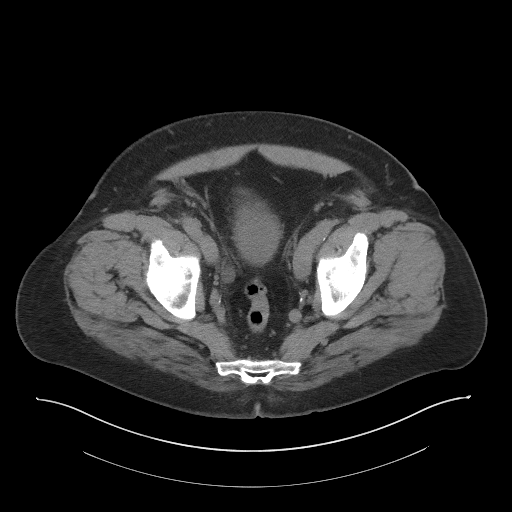
[im 43/106  soft-tissue]
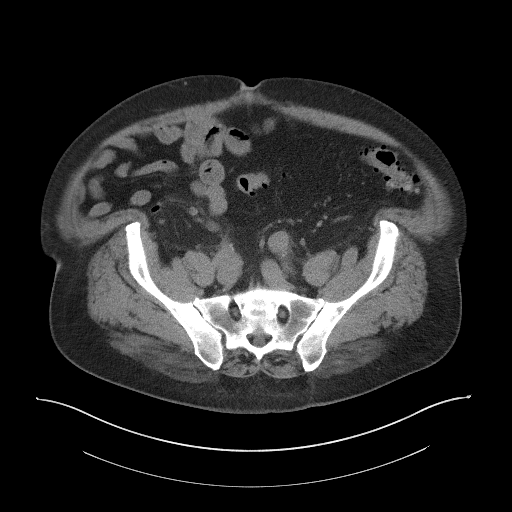
[im 53/106  soft-tissue]
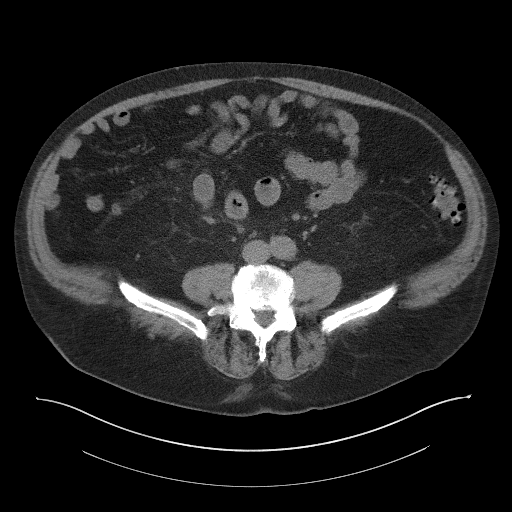
[im 64/106  soft-tissue]
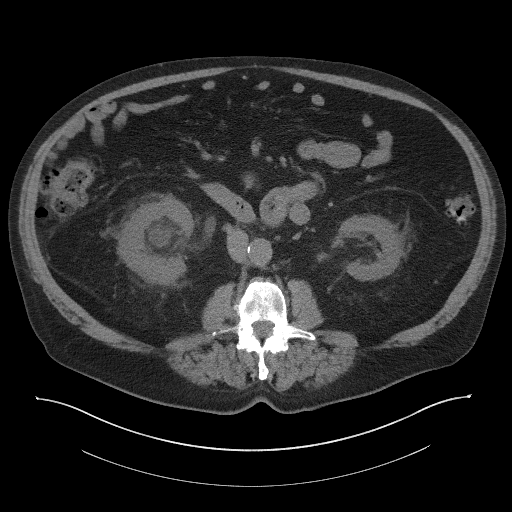
[im 64/106  lung]
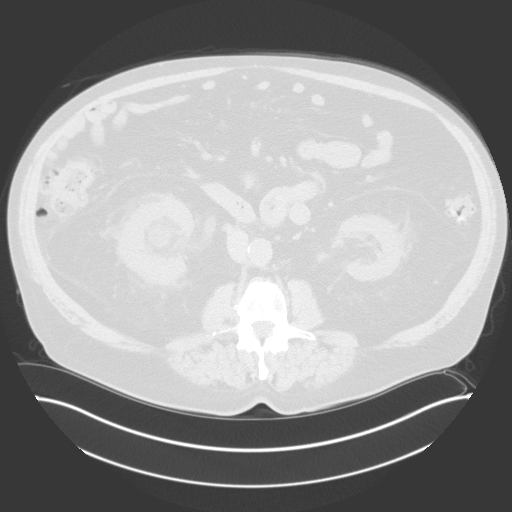
[im 74/106  soft-tissue]
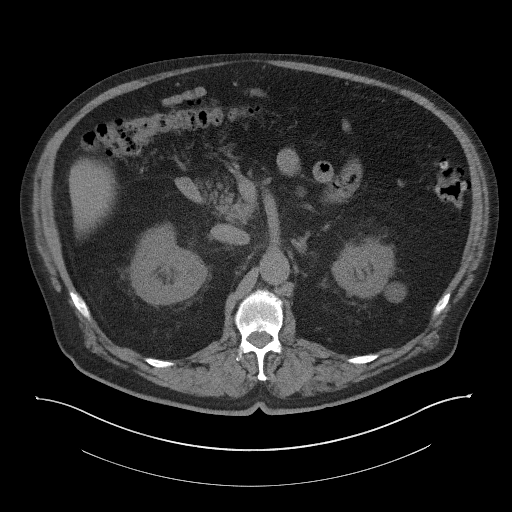
[im 74/106  lung]
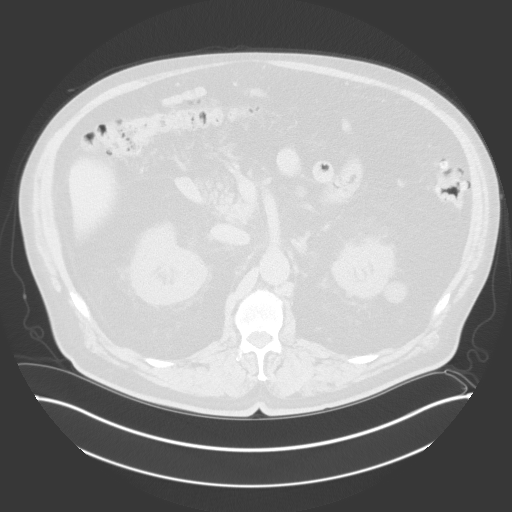
[im 85/106  soft-tissue]
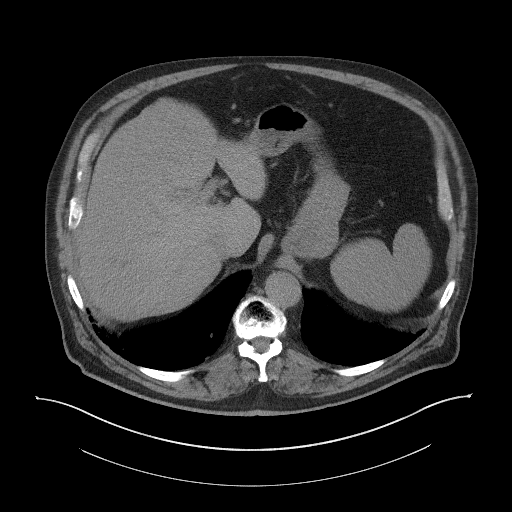
[im 85/106  lung]
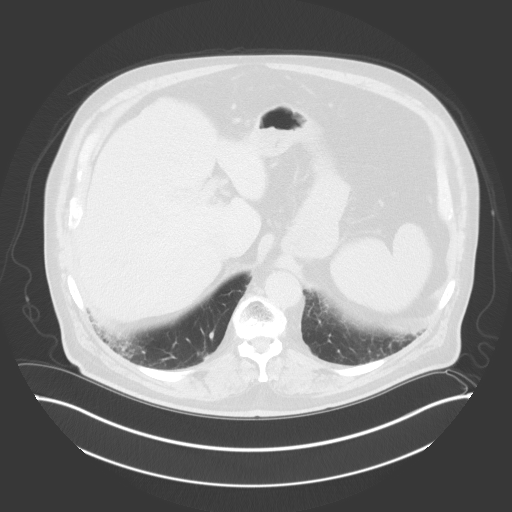
[im 95/106  soft-tissue]
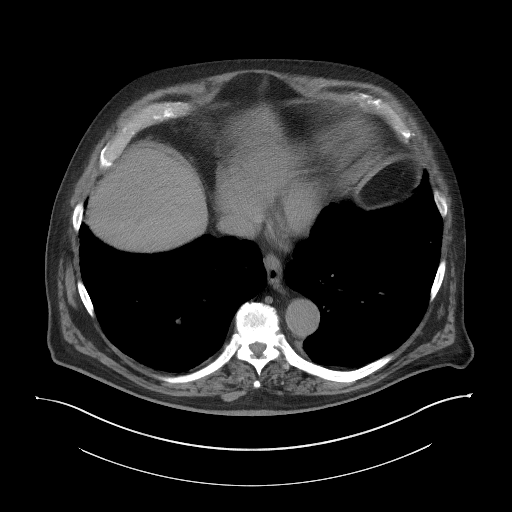
[im 95/106  lung]
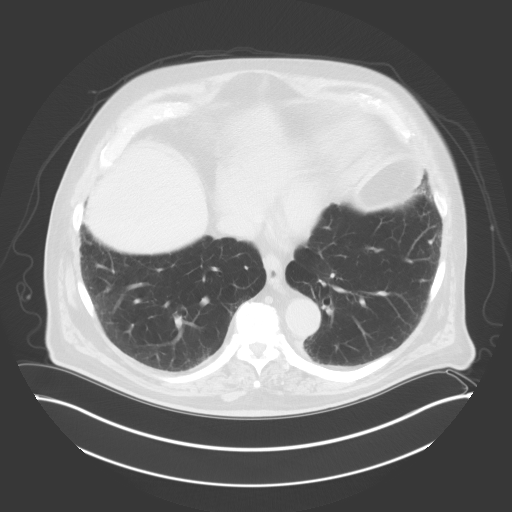
[im 95/106  bone]
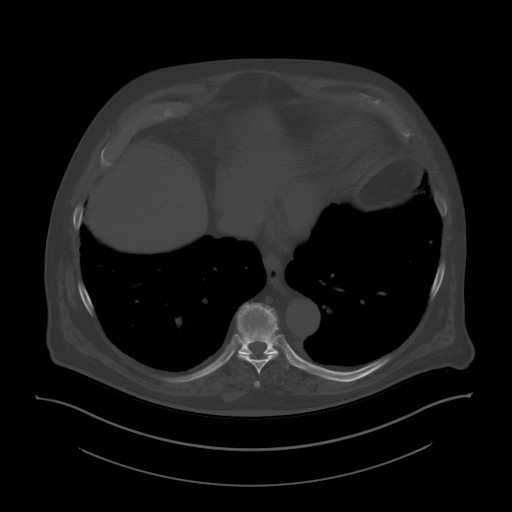

[Series 5: coronal pre · coronal · non-contrast · 0.94mm/px · 3 of 123 slices shown]
[im 31/123  soft-tissue]
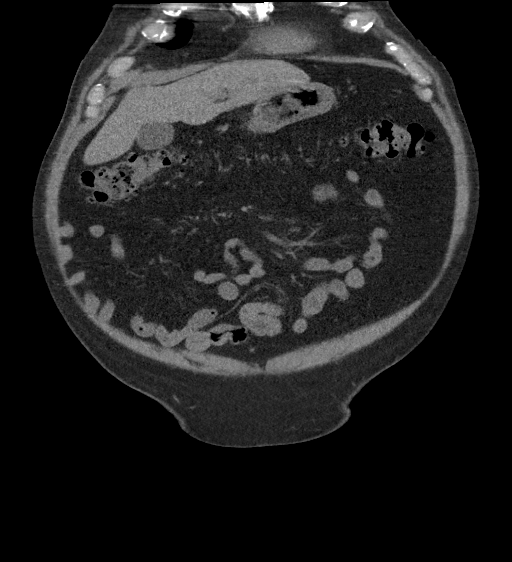
[im 62/123  soft-tissue]
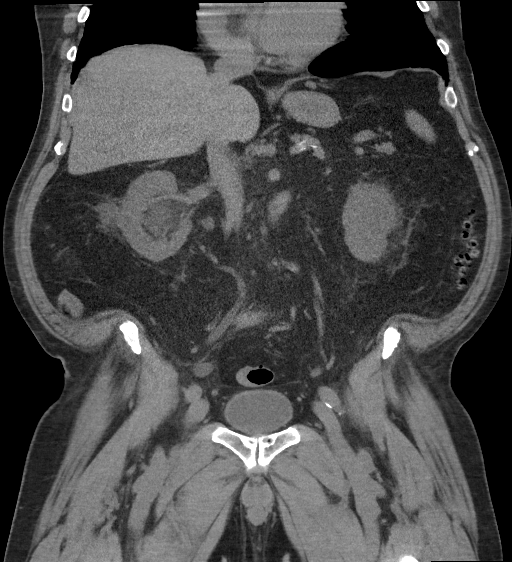
[im 92/123  soft-tissue]
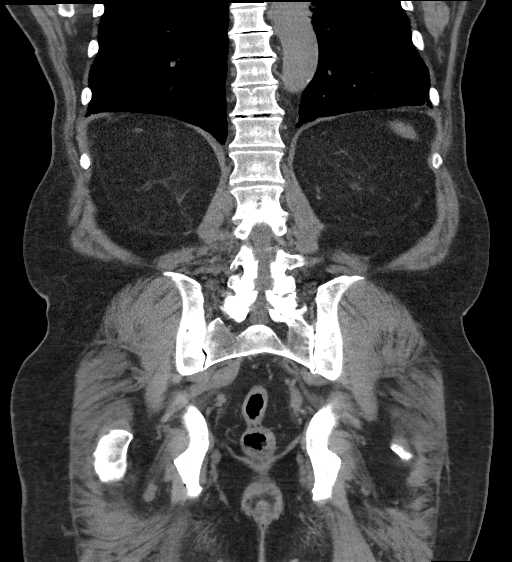

[12 of 46 positions shown; findings below may reference images not displayed]

FINDINGS: Lower chest: No acute abnormality.

Hepatobiliary: No focal liver abnormality is seen. No gallstones,
gallbladder wall thickening, or biliary dilatation.

Pancreas: Unremarkable. No pancreatic ductal dilatation or
surrounding inflammatory changes.

Spleen: Normal in size without focal abnormality.

Adrenals/Urinary Tract: Normal adrenal glands.

Exophytic cyst arises off the lateral cortex of the upper pole of
left kidney measuring 2.2 cm. Within the lateral cortex of the
interpolar right kidney there is a 1.3 cm cyst, image 41/7. Two
additional hypodense right kidney lesions are identified which are
technically too small to reliably characterize measuring up to 8 mm,
image 37/7.

Small bilateral renal calculi are identified. The largest is in the
interpolar collecting system of the left kidney measuring 6 mm,
image 38/2. Bilateral perinephric fat stranding is identified. There
is right-sided hydronephrosis and hydroureter. At the right UVJ
there is a 5 mm stone, image 79/2. No left-sided ureteral calculus
identified.

No suspicious bladder lesion identified.

Stomach/Bowel: Stomach is within normal limits. Appendix appears
normal. No evidence of bowel wall thickening, distention, or
inflammatory changes. Colonic diverticulosis identified. No signs of
acute diverticulitis.

Vascular/Lymphatic: Aortic atherosclerosis. No aneurysm. No
abdominopelvic adenopathy.

Reproductive: Prostate is unremarkable.

Other: No ascites or focal fluid collections.

Musculoskeletal: No acute or significant osseous findings.
Multilevel degenerative disc disease noted within the lower thoracic
and lumbar spine.
IMPRESSION: 1. Right-sided hydronephrosis and hydroureter secondary to 5 mm
right UVJ calculus.
2. Bilateral nephrolithiasis.
3. Bilateral kidney cysts as well as several right-sided too small
to reliably characterize low-density kidney lesions.
4. Aortic Atherosclerosis ([id number]).

## 2024-03-05 ENCOUNTER — Encounter: Payer: Self-pay | Admitting: Urology

## 2024-07-28 ENCOUNTER — Other Ambulatory Visit: Payer: Self-pay

## 2024-07-28 DIAGNOSIS — Z8546 Personal history of malignant neoplasm of prostate: Secondary | ICD-10-CM

## 2024-07-28 DIAGNOSIS — R399 Unspecified symptoms and signs involving the genitourinary system: Secondary | ICD-10-CM

## 2024-07-29 ENCOUNTER — Ambulatory Visit (INDEPENDENT_AMBULATORY_CARE_PROVIDER_SITE_OTHER): Admitting: Urology

## 2024-07-29 VITALS — BP 137/76 | HR 80 | Ht 70.0 in | Wt 267.0 lb

## 2024-07-29 DIAGNOSIS — R399 Unspecified symptoms and signs involving the genitourinary system: Secondary | ICD-10-CM | POA: Diagnosis not present

## 2024-07-29 DIAGNOSIS — Z8546 Personal history of malignant neoplasm of prostate: Secondary | ICD-10-CM

## 2024-07-29 LAB — BLADDER SCAN AMB NON-IMAGING

## 2024-07-29 LAB — PSA: Prostate Specific Ag, Serum: 2.4 ng/mL (ref 0.0–4.0)

## 2024-07-29 MED ORDER — DUTASTERIDE 0.5 MG PO CAPS: 0.5000 mg | ORAL_CAPSULE | Freq: Every day | ORAL | 3 refills | Status: AC

## 2024-07-29 MED ORDER — TAMSULOSIN HCL 0.4 MG PO CAPS: 0.4000 mg | ORAL_CAPSULE | Freq: Every day | ORAL | 11 refills | Status: AC

## 2024-07-29 NOTE — Progress Notes (Signed)
" ° °  07/29/2024 4:58 PM   Paul JONETTA Quale Sr. 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 Gala he transfer his care to [facility] urology in January 2024.  He underwent a prostate biopsy with Dr. 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  He had initially been on Casodex, Flomax , dutasteride .  PSA has remained very low.  We stopped his Casodex at our initial visit in January 2024.  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  He has baseline nocturia 2-3 times at night.  He has sleep apnea but noncompliant with CPAP machine.  No urinary complaints during the day  No major changes in urination since his last visit.  PVR today normal at 0ml.  PSA this year did increase up to 2.4, corrected for dutasteride  4.8 which is an increase from last year's value of 0.8, corrected 1.6.  I recommended repeating the PSA in 1 month, if PSA remains trending upward may need to consider additional imaging like PSMA PET scan, however had low risk disease in 2011 that overall has been very stable.  He denies any significant changes since her last visit.  He stopped the Casodex last year and has had no significant changes in the PSA since that time.  Most recent PSA 0.8, corrected for dutasteride  1.6, and stable from the last few years.  PVR today normal at 0ml.  Primary issue is nocturia 2-4 times at night, he remains noncompliant with CPAP machine.  I recommended trialing the CPAP, as well as behavioral strategies of minimizing fluids prior to bedtime, elevation of the legs, and double voiding prior to bed.  -Repeat PSA 1 month, call with results, consider PSMA PET scan if increasing.  If PSA decreasing or back to baseline RTC 1 year PSA prior and  PVR -Flomax  and dutasteride  refilled -Nocturia strategies discussed, and again encouraged to consider CPAP for nocturia in the setting of sleep apnea   Paul JAYSON Burnet, MD  [facility] [address] [place], [place] [address] [phone]   "

## 2024-07-30 ENCOUNTER — Ambulatory Visit: Payer: Self-pay | Admitting: Urology

## 2024-08-13 ENCOUNTER — Other Ambulatory Visit: Payer: Self-pay

## 2024-08-13 DIAGNOSIS — C61 Malignant neoplasm of prostate: Secondary | ICD-10-CM

## 2024-08-27 ENCOUNTER — Other Ambulatory Visit (INDEPENDENT_AMBULATORY_CARE_PROVIDER_SITE_OTHER)

## 2024-08-27 DIAGNOSIS — C61 Malignant neoplasm of prostate: Secondary | ICD-10-CM

## 2024-08-28 ENCOUNTER — Ambulatory Visit: Payer: Self-pay | Admitting: Urology

## 2024-08-28 ENCOUNTER — Other Ambulatory Visit: Payer: Self-pay

## 2024-08-28 DIAGNOSIS — C61 Malignant neoplasm of prostate: Secondary | ICD-10-CM

## 2024-08-28 LAB — PSA: Prostate Specific Ag, Serum: 1.5 ng/mL (ref 0.0–4.0)

## 2025-08-19 ENCOUNTER — Other Ambulatory Visit

## 2025-08-26 ENCOUNTER — Ambulatory Visit: Admitting: Urology
